# Patient Record
Sex: Female | Born: 1965 | Race: White | Hispanic: No | Marital: Married | Smoking: Never smoker
Health system: Southern US, Community
[De-identification: ages and names within clinical notes are randomized; demographics above are authoritative.]

## PROBLEM LIST (undated history)

## (undated) ENCOUNTER — Emergency Department (HOSPITAL_COMMUNITY): Admission: EM | Discharge status: Absent without leave | Payer: BLUE CROSS/BLUE SHIELD

---

## 1998-03-25 ENCOUNTER — Other Ambulatory Visit: Admission: RE | Admit: 1998-03-25 | Discharge: 1998-03-25 | Payer: Self-pay | Admitting: Obstetrics and Gynecology

## 1998-12-19 ENCOUNTER — Encounter: Payer: Self-pay | Admitting: Specialist

## 1998-12-19 ENCOUNTER — Ambulatory Visit (HOSPITAL_COMMUNITY): Admission: RE | Admit: 1998-12-19 | Discharge: 1998-12-19 | Payer: Self-pay | Admitting: Specialist

## 1999-04-06 ENCOUNTER — Encounter: Payer: Self-pay | Admitting: Specialist

## 1999-04-06 ENCOUNTER — Ambulatory Visit (HOSPITAL_COMMUNITY): Admission: RE | Admit: 1999-04-06 | Discharge: 1999-04-06 | Payer: Self-pay | Admitting: Specialist

## 1999-04-30 ENCOUNTER — Other Ambulatory Visit: Admission: RE | Admit: 1999-04-30 | Discharge: 1999-04-30 | Payer: Self-pay | Admitting: Obstetrics and Gynecology

## 2000-05-30 ENCOUNTER — Other Ambulatory Visit: Admission: RE | Admit: 2000-05-30 | Discharge: 2000-05-30 | Payer: Self-pay | Admitting: Obstetrics and Gynecology

## 2002-03-07 ENCOUNTER — Other Ambulatory Visit: Admission: RE | Admit: 2002-03-07 | Discharge: 2002-03-07 | Payer: Self-pay | Admitting: Obstetrics and Gynecology

## 2002-06-06 ENCOUNTER — Ambulatory Visit (HOSPITAL_COMMUNITY): Admission: RE | Admit: 2002-06-06 | Discharge: 2002-06-06 | Payer: Self-pay | Admitting: Obstetrics and Gynecology

## 2002-06-06 ENCOUNTER — Encounter: Payer: Self-pay | Admitting: Obstetrics and Gynecology

## 2002-10-08 ENCOUNTER — Ambulatory Visit (HOSPITAL_COMMUNITY): Admission: RE | Admit: 2002-10-08 | Discharge: 2002-10-08 | Payer: Self-pay | Admitting: Obstetrics and Gynecology

## 2002-11-13 ENCOUNTER — Encounter: Payer: Self-pay | Admitting: *Deleted

## 2002-11-13 ENCOUNTER — Inpatient Hospital Stay (HOSPITAL_COMMUNITY): Admission: AD | Admit: 2002-11-13 | Discharge: 2002-11-13 | Payer: Self-pay | Admitting: *Deleted

## 2003-02-20 ENCOUNTER — Inpatient Hospital Stay (HOSPITAL_COMMUNITY): Admission: AD | Admit: 2003-02-20 | Discharge: 2003-02-24 | Payer: Self-pay | Admitting: Obstetrics and Gynecology

## 2003-02-25 ENCOUNTER — Inpatient Hospital Stay (HOSPITAL_COMMUNITY): Admission: AD | Admit: 2003-02-25 | Discharge: 2003-02-25 | Payer: Self-pay | Admitting: Obstetrics & Gynecology

## 2003-04-11 ENCOUNTER — Other Ambulatory Visit: Admission: RE | Admit: 2003-04-11 | Discharge: 2003-04-11 | Payer: Self-pay | Admitting: Obstetrics and Gynecology

## 2004-04-23 ENCOUNTER — Other Ambulatory Visit: Admission: RE | Admit: 2004-04-23 | Discharge: 2004-04-23 | Payer: Self-pay | Admitting: Obstetrics and Gynecology

## 2005-10-31 ENCOUNTER — Emergency Department (HOSPITAL_COMMUNITY): Admission: EM | Admit: 2005-10-31 | Discharge: 2005-10-31 | Payer: Self-pay | Admitting: Emergency Medicine

## 2007-02-28 ENCOUNTER — Ambulatory Visit: Payer: Self-pay | Admitting: Pulmonary Disease

## 2007-02-28 ENCOUNTER — Telehealth: Payer: Self-pay | Admitting: Pulmonary Disease

## 2007-02-28 DIAGNOSIS — J309 Allergic rhinitis, unspecified: Secondary | ICD-10-CM | POA: Insufficient documentation

## 2007-02-28 DIAGNOSIS — R059 Cough, unspecified: Secondary | ICD-10-CM | POA: Insufficient documentation

## 2007-02-28 DIAGNOSIS — R05 Cough: Secondary | ICD-10-CM

## 2007-03-01 ENCOUNTER — Telehealth: Payer: Self-pay | Admitting: Pulmonary Disease

## 2007-03-02 ENCOUNTER — Telehealth: Payer: Self-pay | Admitting: Pulmonary Disease

## 2007-03-09 ENCOUNTER — Telehealth: Payer: Self-pay | Admitting: Pulmonary Disease

## 2007-04-28 ENCOUNTER — Telehealth (INDEPENDENT_AMBULATORY_CARE_PROVIDER_SITE_OTHER): Payer: Self-pay | Admitting: *Deleted

## 2007-05-10 ENCOUNTER — Telehealth (INDEPENDENT_AMBULATORY_CARE_PROVIDER_SITE_OTHER): Payer: Self-pay | Admitting: *Deleted

## 2008-01-23 ENCOUNTER — Telehealth (INDEPENDENT_AMBULATORY_CARE_PROVIDER_SITE_OTHER): Payer: Self-pay | Admitting: *Deleted

## 2010-06-26 NOTE — Op Note (Signed)
NAME:  Carla Hooper, Carla Hooper                         ACCOUNT NO.:  [id number]   MEDICAL RECORD NO.:  [id number]                   PATIENT TYPE:  INP   LOCATION:  9122                                 FACILITY:  [facility]   PHYSICIAN:  Dineen Kid. Rana Snare, M.D.                 DATE OF BIRTH:  24-Jul-1965   DATE OF PROCEDURE:  02/21/2003  DATE OF DISCHARGE:                                 OPERATIVE REPORT   PREOPERATIVE DIAGNOSIS:  Intrauterine pregnancy at 38 weeks with arrest of  descent.   POSTOPERATIVE DIAGNOSIS:  Intrauterine pregnancy at 38 weeks with arrest of  descent.   PROCEDURE:  Primary low segment transverse cesarean section.   SURGEON:  Dineen Kid. Rana Snare, M.D.   ANESTHESIA:  Epidural.   INDICATIONS:  [name] is a 45 year old G2, P0, A1, who presented at 38  weeks with spontaneous rupture of membranes around 1600 on February 20, 2003.  With Pitocin augmentation she progressed to complete, at which time she was  unable to push because she was uncomfortable with the pressure of  contractions.  We increased the epidural and she did not push very  effectively with the epidural.  Allowed her to labor down to a 0 station  with left occiput anterior presentation.  She was allowed to push then for  approximately two to 2-1/2 hours without effecting any further descent,  began to get fetal tachycardia, and because of arrest of descent we  proceeded with primary low segment transverse cesarean section.  The risks  and benefits were discussed at length.  Informed consent was obtained.   Findings at the time of surgery were a viable female infant, Apgars were 8  and 9, weight 6 pounds 13 ounces.   DESCRIPTION OF PROCEDURE:  After adequate analgesia the patient was placed  in the supine position with a left lateral tilt.  She was sterilely prepped  and draped.  A Foley catheter had already previously been sterilely placed.  A Pfannenstiel skin incision was made two fingerbreadths above the pubic  symphysis, taken down sharply to the fascia, which was incised transversely,  extended superiorly and inferiorly off the bellies of the rectus muscle,  which were separated sharply in the midline and the peritoneum was entered  sharply.  The bladder flap was created and replaced behind the bladder  blade.  A low segment myotomy incision was made down to the infant's vertex,  extended laterally with the operator's fingertips.  The infant's vertex was  then delivered atraumatically, and the nose and pharynx were then suctioned.  The infant was then delivered, the cord clamp and cut, and handed to the  pediatricians for resuscitation.  Cord blood was obtained, the placenta  extracted manually.  The uterus was exteriorized, wiped clean with a dry  lap.  The myotomy incision was closed in two layers, the first being a  running locking layer, the second being  an imbricating layer of 0 Monocryl  suture.  Irrigation was applied and after adequate hemostasis, the uterus  was placed back in the peritoneal cavity.  The peritoneum was closed with 0  Monocryl and the rectus muscle plicated in the midline and irrigation was  applied, and after adequate hemostasis the fascia was closed with a #1  Vicryl in a running fashion.  Irrigation was applied and after adequate  hemostasis, the skin was stapled and Steri-Strips applied.  The patient  tolerated the procedure well, was stable on transfer to the recovery  room.  The sponge and needle count was normal x3.  Estimated blood loss was  800 mL.  The patient received 1 g of Rocephin after delivery of the placenta  and will stay on this for 24 hours due to the fetal tachycardia during the  end of labor.                                               Dineen Kid Rana Snare, M.D.    DCL/MEDQ  D:  02/21/2003  T:  02/21/2003  Job:  [id number]

## 2010-06-26 NOTE — Discharge Summary (Signed)
NAME:  Carla Hooper, Carla Hooper                         ACCOUNT NO.:  [id number]   MEDICAL RECORD NO.:  [id number]                   PATIENT TYPE:  INP   LOCATION:  9122                                 FACILITY:  [facility]   PHYSICIAN:  Tracie Harrier, M.D.              DATE OF BIRTH:  02-Mar-1965   DATE OF ADMISSION:  02/20/2003  DATE OF DISCHARGE:  02/24/2003                                 DISCHARGE SUMMARY   ADMITTING DIAGNOSES:  1. Intrauterine pregnancy at 66 weeks' estimated gestational age.  2. Spontaneous rupture of membranes.   DISCHARGE DIAGNOSES:  1. Status post low-transverse cesarean section secondary to arrest of     descent.  2. Viable female infant.   PROCEDURE:  Primary low-transverse cesarean section.   REASON FOR ADMISSION:  Please see written H&P.   HOSPITAL COURSE:  The patient was a 45 year old, gravida 2 para 0, that  presented to [facility] at 52 weeks' estimated gestational  age with spontaneous rupture of membranes.  The cervix was noted to be 1- to  -2-cm dilated, 50% effaced, vertex at a -3 station.  Amniotic fluid was  noted to be clear.  Fetal heart tones are reactive.  Labor was augmented  with Pitocin, and the patient did progress to complete dilation, at which  time she was unable to push because she was uncomfortable with the pressures  of her contractions.  Epidural was placed, and she was not able to  effectively push with the epidural.  The patient did push for approximately  2.5 hours without effecting any further descent.  Vertex was at a 0 station.  Due to fetal tachycardia and arrest of descent, the decision was made to  proceed with a primary low-transverse cesarean section.  The patient was  then transferred to the operating room where epidural was dosed to an  adequate surgical level.  A low-transverse incision was made with the  delivery of a viable female infant weighing 6 pounds 13 ounces with Apgar's  of 8 at one minute and 9  at five minutes.  Umbilical cord pH was 7.35.  The  patient tolerated the procedure well and was taken to the recovery room in  stable condition.   On postoperative day one, the patient was without complaint.  Vital signs  were stable.  She was afebrile.  Abdomen was soft with good return of bowel  function.  Fundus was firm and nontender.  Abdominal dressing was noted to  have a small amount of old drainage noted on bandage.  Labs revealed a  hemoglobin of 10.1, platelet count 148,000, WBC count of 11.2.  On  postoperative day two, vital signs were stable.  The patient was without  complaint.  Abdominal dressing had been removed revealing an incision that  was clean, dry and intact.  The patient was ambulating well, tolerating a  regular diet without complaints of nausea or vomiting.  On postoperative day  three, the patient was doing well.  Vital signs were stable.  The incision  was clean, dry and intact.  The staples were removed.  The patient was  discharged home.   CONDITION ON DISCHARGE:  Good.   DIET:  Regular as tolerated.   ACTIVITY:  No heavy lifting.  No driving x 2 weeks.  No vaginal entry.   FOLLOWUP:  The patient is to followup in the office in 1-2 weeks for an  incision check.  She is to call for a temperature greater than 100 degrees,  persistent nausea/vomiting, heavy vaginal bleeding, and/or redness or  drainage from the incisional site.   DISCHARGE MEDICATIONS:  1. Vicodin, #30, one p.o. every four to six hours p.r.n. pain with one     additional refill.  2. Motrin 600 mg every six hours p.r.n.  3. Prenatal vitamins one p.o. daily.  4. Colace one p.o. daily p.r.n.     Julio Sicks, N.P.                        Tracie Harrier, M.D.    CC/MEDQ  D:  03/29/2003  T:  03/30/2003  Job:  [id number]

## 2011-01-13 ENCOUNTER — Institutional Professional Consult (permissible substitution): Payer: Self-pay | Admitting: Cardiology

## 2014-02-22 ENCOUNTER — Emergency Department (HOSPITAL_COMMUNITY): Payer: BLUE CROSS/BLUE SHIELD

## 2014-02-22 ENCOUNTER — Encounter (HOSPITAL_COMMUNITY): Payer: Self-pay | Admitting: *Deleted

## 2014-02-22 ENCOUNTER — Emergency Department (HOSPITAL_COMMUNITY)
Admission: EM | Admit: 2014-02-22 | Discharge: 2014-02-22 | Discharge status: Against Medical Advice | Payer: BLUE CROSS/BLUE SHIELD | Source: Home / Self Care | Attending: Family Medicine | Admitting: Family Medicine

## 2014-02-22 ENCOUNTER — Encounter (HOSPITAL_COMMUNITY): Payer: Self-pay

## 2014-02-22 ENCOUNTER — Emergency Department (HOSPITAL_COMMUNITY)
Admission: EM | Admit: 2014-02-22 | Discharge: 2014-02-22 | Disposition: A | Discharge status: Home / Self Care | Payer: BLUE CROSS/BLUE SHIELD | Attending: Emergency Medicine | Admitting: Emergency Medicine

## 2014-02-22 DIAGNOSIS — R0789 Other chest pain: Secondary | ICD-10-CM | POA: Insufficient documentation

## 2014-02-22 DIAGNOSIS — F419 Anxiety disorder, unspecified: Secondary | ICD-10-CM | POA: Insufficient documentation

## 2014-02-22 DIAGNOSIS — R079 Chest pain, unspecified: Secondary | ICD-10-CM | POA: Diagnosis present

## 2014-02-22 DIAGNOSIS — I10 Essential (primary) hypertension: Secondary | ICD-10-CM

## 2014-02-22 DIAGNOSIS — Z79899 Other long term (current) drug therapy: Secondary | ICD-10-CM | POA: Insufficient documentation

## 2014-02-22 LAB — CBC
HCT: 39.7 % (ref 36.0–46.0)
Hemoglobin: 13.6 g/dL (ref 12.0–15.0)
MCH: 32.4 pg (ref 26.0–34.0)
MCHC: 34.3 g/dL (ref 30.0–36.0)
MCV: 94.5 fL (ref 78.0–100.0)
PLATELETS: 263 10*3/uL (ref 150–400)
RBC: 4.2 MIL/uL (ref 3.87–5.11)
RDW: 11.8 % (ref 11.5–15.5)
WBC: 7.7 10*3/uL (ref 4.0–10.5)

## 2014-02-22 LAB — BASIC METABOLIC PANEL
ANION GAP: 10 (ref 5–15)
BUN: 11 mg/dL (ref 6–23)
CALCIUM: 9 mg/dL (ref 8.4–10.5)
CO2: 27 mmol/L (ref 19–32)
Chloride: 104 mEq/L (ref 96–112)
Creatinine, Ser: 0.75 mg/dL (ref 0.50–1.10)
GFR calc Af Amer: >90 mL/min (ref >90)
GLUCOSE: 100 mg/dL — AB (ref 70–99)
POTASSIUM: 3.8 mmol/L (ref 3.5–5.1)
Sodium: 141 mmol/L (ref 135–145)

## 2014-02-22 LAB — I-STAT TROPONIN, ED: TROPONIN I, POC: 0 ng/mL (ref 0.00–0.08)

## 2014-02-22 MED ORDER — HYDROCHLOROTHIAZIDE 25 MG PO TABS: 25.0000 mg | ORAL_TABLET | Freq: Every day | ORAL | Status: AC

## 2014-02-22 NOTE — ED Notes (Signed)
States she was informed at her OB visit, that her BP is elevated. When VS checked, she became anxious , tearful. C/o tightness in mid chest, left chest. Concerned she may be having an anxiety episode. Skin w/d/color good

## 2014-02-22 NOTE — ED Provider Notes (Signed)
CSN: [id number]     Arrival date & time 02/22/14  1229 History   First MD Initiated Contact with Patient 02/22/14 1325     Chief Complaint  Patient presents with  . Hypertension   (Consider location/radiation/quality/duration/timing/severity/associated sxs/prior Treatment) HPI Comments: Patient States she has been under a great deal of stress since her mother died in 12/19/2013. However, she endorses having intermittent episodes of left chest tightness over the past 6-8 mos. Has seen a cardiologist in the past, but this was for palpitations and reports that her evaluation was reported as normal (Dr. Erlene Quan). She is also concerned that when she went to her annual ObGyn visit 2 days ago, she was told that her BP was elevated and it has remained elevated. Has been checking BP on daily basis. Has never been treat for HTN in the past. PCP: Dr. Cyndia Bent  Patient is a 48 y.o. female presenting with hypertension and chest pain. The history is provided by the patient.  Hypertension Associated symptoms include chest pain. Pertinent negatives include no abdominal pain, no headaches and no shortness of breath.  Chest Pain Pain location:  L chest Pain quality: pressure and tightness   Pain radiates to:  Does not radiate Pain radiates to the back: no   Pain severity:  Moderate Onset quality:  Gradual Duration:  6 months Timing:  Intermittent Progression:  Waxing and waning Chronicity:  New Context: stress   Relieved by:  Nothing Worsened by:  Nothing tried Ineffective treatments:  None tried Associated symptoms: anxiety and fatigue   Associated symptoms: no abdominal pain, no AICD problem, no altered mental status, no anorexia, no back pain, no claudication, no cough, no diaphoresis, no dizziness, no dysphagia, no fever, no headache, no heartburn, no lower extremity edema, no nausea, no near-syncope, no numbness, no orthopnea, no palpitations, no PND, no shortness of breath, no syncope, not vomiting  and no weakness   Risk factors: hypertension   Risk factors: no coronary artery disease, no high cholesterol, no prior DVT/PE and no smoking     History reviewed. No pertinent past medical history. History reviewed. No pertinent past surgical history. History reviewed. No pertinent family history. History  Substance Use Topics  . Smoking status: Never Smoker   . Smokeless tobacco: Not on file  . Alcohol Use: Yes     Comment: rare   OB History    No data available     Review of Systems  Constitutional: Positive for fatigue. Negative for fever and diaphoresis.  HENT: Negative.  Negative for trouble swallowing.   Eyes: Negative.   Respiratory: Positive for chest tightness. Negative for cough, shortness of breath and wheezing.   Cardiovascular: Positive for chest pain. Negative for palpitations, orthopnea, claudication, leg swelling, syncope, PND and near-syncope.  Gastrointestinal: Negative for heartburn, nausea, vomiting, abdominal pain and anorexia.  Genitourinary: Negative.   Musculoskeletal: Negative for back pain.  Skin: Negative.   Neurological: Negative for dizziness, syncope, weakness, numbness and headaches.  Psychiatric/Behavioral: The patient is nervous/anxious.     Allergies  Review of patient's allergies indicates no known allergies.  Home Medications   Prior to Admission medications   Medication Sig Start Date End Date Taking? Authorizing Provider  zolpidem (AMBIEN) 5 MG tablet Take 5 mg by mouth at bedtime as needed for sleep.   Yes Historical Provider, MD   BP 180/110 mmHg  Pulse 93  Temp(Src) 97.2 F (36.2 C) (Oral)  Resp 20  SpO2 98% Physical Exam  Constitutional:  She is oriented to person, place, and time. She appears well-developed and well-nourished.  +tearful  HENT:  Head: Normocephalic and atraumatic.  Eyes: Conjunctivae are normal. No scleral icterus.  Neck: Normal range of motion. Neck supple. No JVD present.  Cardiovascular: Normal rate,  regular rhythm and normal heart sounds.   Pulmonary/Chest: Effort normal and breath sounds normal. No respiratory distress. She has no wheezes. She exhibits no tenderness.  Abdominal: Soft. Bowel sounds are normal. She exhibits no distension. There is no tenderness.  Musculoskeletal: Normal range of motion. She exhibits no edema.  Neurological: She is alert and oriented to person, place, and time.  Skin: Skin is warm and dry. No rash noted. No erythema.  Psychiatric: She has a normal mood and affect. Her behavior is normal.  Nursing note and vitals reviewed.   ED Course  Procedures (including critical care time) Labs Review Labs Reviewed - No data to display  Imaging Review No results found.   MDM   1. Chest tightness or pressure   ECG: NSR at 82 bpm with no ectopy or dynamic ST/T wave changes. Normal intervals. No previous tracings available for comparison. No evidence of STEMI It was advised that [facility] be [facility] further evaluation of chest pain and hypertension, however, patient states she does not want to go to the hospital as she is concerned about the high deductible on her insurance plan. After lengthy discussion about risks of not pursuing additional evaluation, patient still wished to sign out AMA. She was made aware that she was always welcome to return should she wish to be re-evaluated.    Ria ClockJennifer Lee H Osmany Azer, [place] 02/22/14 [phone]

## 2014-02-22 NOTE — Discharge Instructions (Signed)
As we discussed, I would strongly recommend that you seek additional evaluation at [facility] as soon as possible. I have concerns regarding not only your elevated blood pressure but your chest discomfort as well.

## 2014-02-22 NOTE — ED Provider Notes (Addendum)
CSN: [id number]     Arrival date & time 02/22/14  1421 History   First MD Initiated Contact with Patient 02/22/14 1723     Chief Complaint  Patient presents with  . Hypertension  . Chest Pain     (Consider location/radiation/quality/duration/timing/severity/associated sxs/prior Treatment) Patient is a 49 y.o. female presenting with hypertension and chest pain. The history is provided by the patient.  Hypertension This is a new (Have an annual OB/GYN visit 4 days ago and at that time her blood pressure was elevated in the office. She is now been checking every day since that time and is been elevated. Prior to this the last time her blood pressure was checked was 1 year ago.) problem. Episode onset: 4 days ago. The problem occurs constantly. The problem has not changed since onset.Associated symptoms include chest pain. Pertinent negatives include no headaches. Associated symptoms comments: Intermittent episodes of chest pressure that are rare and none currently.  States she has been very stressed and when she feels anxious she will get left sided chest pressure without SOB, diphoresis, N/V.  No recent OTC meds and no regular meds.  . Nothing aggravates the symptoms. Nothing relieves the symptoms. She has tried nothing for the symptoms. The treatment provided no relief.  Chest Pain Associated symptoms: no headache     History reviewed. No pertinent past medical history. History reviewed. No pertinent past surgical history. History reviewed. No pertinent family history. History  Substance Use Topics  . Smoking status: Never Smoker   . Smokeless tobacco: Not on file  . Alcohol Use: Yes     Comment: rare   OB History    No data available     Review of Systems  Cardiovascular: Positive for chest pain.  Neurological: Negative for headaches.  All other systems reviewed and are negative.     Allergies  Review of patient's allergies indicates no known allergies.  Home Medications    Prior to Admission medications   Medication Sig Start Date End Date Taking? Authorizing Provider  zolpidem (AMBIEN) 5 MG tablet Take 5 mg by mouth at bedtime as needed for sleep.    Historical Provider, MD   BP 143/95 mmHg  Pulse 77  Temp(Src) 98.3 F (36.8 C) (Oral)  Resp 21  SpO2 100%  LMP 02/22/2014 Physical Exam  Constitutional: She is oriented to person, place, and time. She appears well-developed and well-nourished. No distress.  tearful  HENT:  Head: Normocephalic and atraumatic.  Mouth/Throat: Oropharynx is clear and moist.  Eyes: Conjunctivae and EOM are normal. Pupils are equal, round, and reactive to light.  Neck: Normal range of motion. Neck supple.  Cardiovascular: Normal rate, regular rhythm and intact distal pulses.   No murmur heard. Pulmonary/Chest: Effort normal and breath sounds normal. No respiratory distress. She has no wheezes. She has no rales.  Abdominal: Soft. She exhibits no distension. There is no tenderness. There is no rebound and no guarding.  Musculoskeletal: Normal range of motion. She exhibits no edema or tenderness.  Neurological: She is alert and oriented to person, place, and time.  Skin: Skin is warm and dry. No rash noted. No erythema.  Psychiatric: Her behavior is normal. Her mood appears anxious.  Nursing note and vitals reviewed.   ED Course  Procedures (including critical care time) Labs Review Labs Reviewed  BASIC METABOLIC PANEL - Abnormal; Notable for the following:    Glucose, Bld 100 (*)    All other components within normal limits  CBC  Rosezena SensorI-STAT TROPOININ, ED    Imaging Review Dg Chest 2 View  02/22/2014   CLINICAL DATA:  Hypertension.  Chest tightness.  Initial encounter  EXAM: CHEST  2 VIEW  COMPARISON:  02/28/2007  FINDINGS: Midline trachea.  Normal heart size and mediastinal contours.  Sharp costophrenic angles.  No pneumothorax.  Clear lungs.  IMPRESSION: No active cardiopulmonary disease.   Electronically Signed   By:  Jeronimo GreavesKyle  Talbot M.D.   On: 02/22/2014 15:34     EKG Interpretation   Date/Time:  Friday February 22 2014 14:26:28 EST Ventricular Rate:  86 PR Interval:  124 QRS Duration: 84 QT Interval:  368 QTC Calculation: 440 R Axis:   3 Text Interpretation:  Normal sinus rhythm Normal ECG No significant change  since last tracing Confirmed by Anitra LauthPLUNKETT  MD, Alphonzo LemmingsWHITNEY ([id number]) on 02/22/2014  5:21:58 PM      MDM   Final diagnoses:  Essential hypertension    Patient presents with a history of hypertension that it's recently been brought to her attention when she went to her OB/GYN office 4 days ago. Patient has never had a prior history of hypertension in the past but last checked her blood pressure approximately 1 year ago. She is been under a great deal of stress and states eats a fairly high salt diet as well as a 10 pound weight gain. She has not had any lab testing done in approximately one year but denies a history of anemia no family history of cardiac disease or hypertension.  Patient has become very anxious because her blood pressure is been elevated and states she will occasionally get a chest pressure that is usually due to anxiety. She denies any chest pressure currently and she has no associated symptoms with it. The chest pain she describes sounds atypical she is not currently having an episode. Her EKG is within normal limits and a troponin done while she was waiting in triage is negative. Her creatinine and hemoglobin are normal. Discussed with patient the need to check a thyroid panel however she is requesting that she get that done at her PCP office. Patient's blood pressure here initially was in the 180s and then the last check was 145/95. Will start her on a low-dose of hydrochlorothiazide and have her follow-up with the PMD for further care. She was given strict return precautions for any chest pain, shortness of breath or focal weakness.    Gwyneth SproutWhitney Gianno Volner, MD 02/22/14 [id number]  Gwyneth SproutWhitney  Gaven Eugene, MD 02/22/14 1816

## 2014-02-22 NOTE — ED Notes (Signed)
Pt reports having her yearly check up and was told that her bp is elevated. Went to [facility] and sent here due to htn and pt became anxious due to htn and then had episode of chest pain. Pt is tearful and anxious at triage, ekg done.

## 2014-02-22 NOTE — ED Notes (Signed)
JL Presson , PA, advised of BP

## 2015-08-07 DIAGNOSIS — F419 Anxiety disorder, unspecified: Secondary | ICD-10-CM | POA: Diagnosis not present

## 2015-09-16 DIAGNOSIS — F411 Generalized anxiety disorder: Secondary | ICD-10-CM | POA: Diagnosis not present

## 2015-12-05 DIAGNOSIS — F411 Generalized anxiety disorder: Secondary | ICD-10-CM | POA: Diagnosis not present

## 2015-12-05 DIAGNOSIS — M5440 Lumbago with sciatica, unspecified side: Secondary | ICD-10-CM | POA: Diagnosis not present

## 2015-12-05 DIAGNOSIS — Z23 Encounter for immunization: Secondary | ICD-10-CM | POA: Diagnosis not present

## 2016-01-21 DIAGNOSIS — R5383 Other fatigue: Secondary | ICD-10-CM | POA: Diagnosis not present

## 2016-01-21 DIAGNOSIS — R2689 Other abnormalities of gait and mobility: Secondary | ICD-10-CM | POA: Diagnosis not present

## 2016-01-21 DIAGNOSIS — M2550 Pain in unspecified joint: Secondary | ICD-10-CM | POA: Diagnosis not present

## 2016-01-21 DIAGNOSIS — I10 Essential (primary) hypertension: Secondary | ICD-10-CM | POA: Diagnosis not present

## 2016-04-14 DIAGNOSIS — Z0000 Encounter for general adult medical examination without abnormal findings: Secondary | ICD-10-CM | POA: Diagnosis not present

## 2016-04-14 DIAGNOSIS — Z1321 Encounter for screening for nutritional disorder: Secondary | ICD-10-CM | POA: Diagnosis not present

## 2016-04-21 DIAGNOSIS — F321 Major depressive disorder, single episode, moderate: Secondary | ICD-10-CM | POA: Diagnosis not present

## 2016-04-21 DIAGNOSIS — I10 Essential (primary) hypertension: Secondary | ICD-10-CM | POA: Diagnosis not present

## 2016-04-21 DIAGNOSIS — Z0000 Encounter for general adult medical examination without abnormal findings: Secondary | ICD-10-CM | POA: Diagnosis not present

## 2016-04-21 DIAGNOSIS — F5104 Psychophysiologic insomnia: Secondary | ICD-10-CM | POA: Diagnosis not present

## 2016-05-26 DIAGNOSIS — R079 Chest pain, unspecified: Secondary | ICD-10-CM | POA: Diagnosis not present

## 2016-05-26 DIAGNOSIS — R0789 Other chest pain: Secondary | ICD-10-CM | POA: Diagnosis not present

## 2016-05-26 DIAGNOSIS — M546 Pain in thoracic spine: Secondary | ICD-10-CM | POA: Diagnosis not present

## 2016-06-05 DIAGNOSIS — G8929 Other chronic pain: Secondary | ICD-10-CM | POA: Diagnosis not present

## 2016-06-05 DIAGNOSIS — M545 Low back pain: Secondary | ICD-10-CM | POA: Diagnosis not present

## 2016-07-21 DIAGNOSIS — Z6828 Body mass index (BMI) 28.0-28.9, adult: Secondary | ICD-10-CM | POA: Diagnosis not present

## 2016-07-21 DIAGNOSIS — Z01419 Encounter for gynecological examination (general) (routine) without abnormal findings: Secondary | ICD-10-CM | POA: Diagnosis not present

## 2016-07-21 DIAGNOSIS — Z1231 Encounter for screening mammogram for malignant neoplasm of breast: Secondary | ICD-10-CM | POA: Diagnosis not present

## 2016-08-18 DIAGNOSIS — Z13820 Encounter for screening for osteoporosis: Secondary | ICD-10-CM | POA: Diagnosis not present

## 2016-08-26 ENCOUNTER — Encounter: Payer: Self-pay | Admitting: Obstetrics and Gynecology

## 2016-09-28 DIAGNOSIS — E876 Hypokalemia: Secondary | ICD-10-CM | POA: Diagnosis not present

## 2016-09-28 DIAGNOSIS — E871 Hypo-osmolality and hyponatremia: Secondary | ICD-10-CM | POA: Diagnosis not present

## 2016-09-28 DIAGNOSIS — R509 Fever, unspecified: Secondary | ICD-10-CM | POA: Diagnosis not present

## 2016-09-28 DIAGNOSIS — R109 Unspecified abdominal pain: Secondary | ICD-10-CM | POA: Diagnosis not present

## 2016-09-29 DIAGNOSIS — R110 Nausea: Secondary | ICD-10-CM | POA: Diagnosis not present

## 2016-09-29 DIAGNOSIS — R1084 Generalized abdominal pain: Secondary | ICD-10-CM | POA: Diagnosis not present

## 2016-09-29 DIAGNOSIS — R945 Abnormal results of liver function studies: Secondary | ICD-10-CM | POA: Diagnosis not present

## 2016-09-29 DIAGNOSIS — E860 Dehydration: Secondary | ICD-10-CM | POA: Diagnosis not present

## 2016-09-30 DIAGNOSIS — R1084 Generalized abdominal pain: Secondary | ICD-10-CM | POA: Diagnosis not present

## 2016-10-01 DIAGNOSIS — R197 Diarrhea, unspecified: Secondary | ICD-10-CM | POA: Diagnosis not present

## 2016-10-02 ENCOUNTER — Emergency Department (HOSPITAL_COMMUNITY)
Admission: EM | Admit: 2016-10-02 | Discharge: 2016-10-02 | Disposition: A | Discharge status: Home / Self Care | Payer: BLUE CROSS/BLUE SHIELD | Attending: Emergency Medicine | Admitting: Emergency Medicine

## 2016-10-02 ENCOUNTER — Encounter (HOSPITAL_COMMUNITY): Payer: Self-pay | Admitting: Emergency Medicine

## 2016-10-02 DIAGNOSIS — E860 Dehydration: Secondary | ICD-10-CM | POA: Insufficient documentation

## 2016-10-02 DIAGNOSIS — R109 Unspecified abdominal pain: Secondary | ICD-10-CM | POA: Diagnosis not present

## 2016-10-02 DIAGNOSIS — Z79899 Other long term (current) drug therapy: Secondary | ICD-10-CM | POA: Diagnosis not present

## 2016-10-02 DIAGNOSIS — R197 Diarrhea, unspecified: Secondary | ICD-10-CM | POA: Diagnosis not present

## 2016-10-02 DIAGNOSIS — E876 Hypokalemia: Secondary | ICD-10-CM | POA: Diagnosis not present

## 2016-10-02 LAB — CBC
HCT: 34.7 % — ABNORMAL LOW (ref 36.0–46.0)
Hemoglobin: 12.3 g/dL (ref 12.0–15.0)
MCH: 31.5 pg (ref 26.0–34.0)
MCHC: 35.4 g/dL (ref 30.0–36.0)
MCV: 89 fL (ref 78.0–100.0)
Platelets: 273 10*3/uL (ref 150–400)
RBC: 3.9 MIL/uL (ref 3.87–5.11)
RDW: 12 % (ref 11.5–15.5)
WBC: 7.1 10*3/uL (ref 4.0–10.5)

## 2016-10-02 LAB — COMPREHENSIVE METABOLIC PANEL
ALT: 41 U/L (ref 14–54)
AST: 33 U/L (ref 15–41)
Albumin: 3.5 g/dL (ref 3.5–5.0)
Alkaline Phosphatase: 80 U/L (ref 38–126)
Anion gap: 8 (ref 5–15)
BUN: 9 mg/dL (ref 6–20)
CALCIUM: 8.8 mg/dL — AB (ref 8.9–10.3)
CHLORIDE: 96 mmol/L — AB (ref 101–111)
CO2: 30 mmol/L (ref 22–32)
Creatinine, Ser: 0.87 mg/dL (ref 0.44–1.00)
GFR calc Af Amer: >60 mL/min (ref >60)
GFR calc non Af Amer: >60 mL/min (ref >60)
Glucose, Bld: 100 mg/dL — ABNORMAL HIGH (ref 65–99)
Potassium: 2.5 mmol/L — CL (ref 3.5–5.1)
SODIUM: 134 mmol/L — AB (ref 135–145)
Total Bilirubin: 0.6 mg/dL (ref 0.3–1.2)
Total Protein: 6.8 g/dL (ref 6.5–8.1)

## 2016-10-02 LAB — POTASSIUM: Potassium: 3.1 mmol/L — ABNORMAL LOW (ref 3.5–5.1)

## 2016-10-02 LAB — LIPASE, BLOOD: LIPASE: 271 U/L — AB (ref 11–51)

## 2016-10-02 LAB — MAGNESIUM: MAGNESIUM: 2 mg/dL (ref 1.7–2.4)

## 2016-10-02 MED ORDER — SODIUM CHLORIDE 0.9 % IV BOLUS (SEPSIS)
1000.0000 mL | Freq: Once | INTRAVENOUS | Status: AC
  Administered 2016-10-02: 1000 mL via INTRAVENOUS

## 2016-10-02 MED ORDER — CIPROFLOXACIN HCL 500 MG PO TABS: 500.0000 mg | ORAL_TABLET | Freq: Two times a day (BID) | ORAL | 0 refills | Status: AC

## 2016-10-02 MED ORDER — CIPROFLOXACIN HCL 500 MG PO TABS
500.0000 mg | ORAL_TABLET | Freq: Once | ORAL | Status: AC
  Administered 2016-10-02: 500 mg via ORAL
  Filled 2016-10-02: qty 1

## 2016-10-02 MED ORDER — LOPERAMIDE HCL 2 MG PO CAPS
4.0000 mg | ORAL_CAPSULE | Freq: Once | ORAL | Status: AC
  Administered 2016-10-02: 4 mg via ORAL
  Filled 2016-10-02: qty 2

## 2016-10-02 MED ORDER — POTASSIUM CHLORIDE CRYS ER 20 MEQ PO TBCR
60.0000 meq | EXTENDED_RELEASE_TABLET | Freq: Once | ORAL | Status: AC
  Administered 2016-10-02: 60 meq via ORAL
  Filled 2016-10-02: qty 3

## 2016-10-02 MED ORDER — SODIUM CHLORIDE 0.9 % IV BOLUS (SEPSIS)
2000.0000 mL | Freq: Once | INTRAVENOUS | Status: AC
  Administered 2016-10-02: 2000 mL via INTRAVENOUS

## 2016-10-02 MED ORDER — POTASSIUM CHLORIDE CRYS ER 20 MEQ PO TBCR
40.0000 meq | EXTENDED_RELEASE_TABLET | Freq: Once | ORAL | Status: AC
  Administered 2016-10-02: 40 meq via ORAL
  Filled 2016-10-02: qty 2

## 2016-10-02 NOTE — ED Provider Notes (Signed)
[facility]-EMERGENCY DEPT Provider Note   CSN: [id number] Arrival date & time: 10/02/16  1001     History   Chief Complaint Chief Complaint  Patient presents with  . Abdominal Pain  . Diarrhea    HPI Carla Hooper is a 51 y.o. female.patient complains of diarrhea onset 6 days ago accompanied by intermittent crampy lower abdominal pain. She is pain free at present. She reports she had 50 episodes of diarrhea 6 days ago. Diarrhea had slowed after a few days. Associated symptoms include fever with maximum temperature of 102 degrees 3 days ago. She saw her primary care physician 5 days ago who referred her to a gastroenterologist. She saw a gastroenterologist's PA was prescribed Zantac and received 1 L of intravenous fluids in ER. Diarrhea transiently slowed until last night when she had 10 episodes of mucousy diarrhea since last night. She denies abdominal pain at present. She treated herself with one dose of Imodium since onset of illness without relief.Has also had one or 2 doses of IV probiotic She denies recent travel or antibiotic use denies nausea or vomiting. She ate pizza last night. Feels dehydrated. Nothing makes symptoms better or worse.  HPI  History reviewed. No pertinent past medical history.  Patient Active Problem List   Diagnosis Date Noted  . ALLERGIC RHINITIS 02/28/2007  . COUGH 02/28/2007    History reviewed. No pertinent surgical history.  OB History    No data available       Home Medications    Prior to Admission medications   Medication Sig Start Date End Date Taking? Authorizing Provider  hydrochlorothiazide (HYDRODIURIL) 25 MG tablet Take 1 tablet (25 mg total) by mouth daily. 02/22/14   Gwyneth Sprout, MD  zolpidem (AMBIEN) 5 MG tablet Take 5 mg by mouth at bedtime as needed for sleep.    [provider]    Family History History reviewed. No pertinent family history.  Social History Social History  Substance Use Topics  . Smoking  status: Never Smoker  . Smokeless tobacco: Not on file  . Alcohol use Yes     Comment: rare     Allergies   Patient has no known allergies.   Review of Systems Review of Systems  Constitutional: Positive for fever.  HENT: Negative.   Respiratory: Negative.   Cardiovascular: Negative.   Gastrointestinal: Positive for abdominal pain and diarrhea.  Musculoskeletal: Negative.   Skin: Negative.   Neurological: Positive for light-headedness.  Psychiatric/Behavioral: Negative.   All other systems reviewed and are negative.    Physical Exam Updated Vital Signs BP 105/65   Pulse 99   Temp 99 F (37.2 C) (Oral)   Resp 16   SpO2 99%   Physical Exam  Constitutional: She appears well-developed and well-nourished. No distress.  Mucous members dry  HENT:  Head: Normocephalic and atraumatic.  Eyes: Pupils are equal, round, and reactive to light. Conjunctivae are normal.  Neck: Neck supple. No tracheal deviation present. No thyromegaly present.  Cardiovascular: Normal rate and regular rhythm.   No murmur heard. Pulmonary/Chest: Effort normal and breath sounds normal.  Abdominal: Soft. Bowel sounds are normal. She exhibits no distension. There is no tenderness.  Musculoskeletal: Normal range of motion. She exhibits no edema or tenderness.  Neurological: She is alert. Coordination normal.  Skin: Skin is warm and dry. No rash noted.  Psychiatric: She has a normal mood and affect.  Nursing note and vitals reviewed.    ED Treatments / Results  Labs (all  labs ordered are listed, but only abnormal results are displayed) Labs Reviewed  LIPASE, BLOOD - Abnormal; Notable for the following:       Result Value   Lipase 271 (*)    All other components within normal limits  COMPREHENSIVE METABOLIC PANEL - Abnormal; Notable for the following:    Sodium 134 (*)    Potassium 2.5 (*)    Chloride 96 (*)    Glucose, Bld 100 (*)    Calcium 8.8 (*)    All other components within normal  limits  CBC - Abnormal; Notable for the following:    HCT 34.7 (*)    All other components within normal limits  MAGNESIUM    EKG  EKG Interpretation  Date/Time:  Saturday October 02 2016 13:10:24 EDT Ventricular Rate:  86 PR Interval:    QRS Duration: 102 QT Interval:  371 QTC Calculation: 444 R Axis:   -4 Text Interpretation:  Sinus rhythm Low voltage, precordial leads Borderline T abnormalities, diffuse leads No significant change since last tracing Confirmed by Doug Sou [phone]) on 10/02/2016 1:13:13 PM      Results for orders placed or performed during the hospital encounter of 10/02/16  Lipase, blood  Result Value Ref Range   Lipase 271 (H) 11 - 51 U/L  Comprehensive metabolic panel  Result Value Ref Range   Sodium 134 (L) 135 - 145 mmol/L   Potassium 2.5 (LL) 3.5 - 5.1 mmol/L   Chloride 96 (L) 101 - 111 mmol/L   CO2 30 22 - 32 mmol/L   Glucose, Bld 100 (H) 65 - 99 mg/dL   BUN 9 6 - 20 mg/dL   Creatinine, Ser 8.32 0.44 - 1.00 mg/dL   Calcium 8.8 (L) 8.9 - 10.3 mg/dL   Total Protein 6.8 6.5 - 8.1 g/dL   Albumin 3.5 3.5 - 5.0 g/dL   AST 33 15 - 41 U/L   ALT 41 14 - 54 U/L   Alkaline Phosphatase 80 38 - 126 U/L   Total Bilirubin 0.6 0.3 - 1.2 mg/dL   GFR calc non Af Amer >60 >60 mL/min   GFR calc Af Amer >60 >60 mL/min   Anion gap 8 5 - 15  CBC  Result Value Ref Range   WBC 7.1 4.0 - 10.5 K/uL   RBC 3.90 3.87 - 5.11 MIL/uL   Hemoglobin 12.3 12.0 - 15.0 g/dL   HCT 91.9 (L) 16.6 - 06.0 %   MCV 89.0 78.0 - 100.0 fL   MCH 31.5 26.0 - 34.0 pg   MCHC 35.4 30.0 - 36.0 g/dL   RDW 04.5 99.7 - 74.1 %   Platelets 273 150 - 400 K/uL  Magnesium  Result Value Ref Range   Magnesium 2.0 1.7 - 2.4 mg/dL  Potassium  Result Value Ref Range   Potassium 3.1 (L) 3.5 - 5.1 mmol/L   No results found.  Radiology No results found.  Procedures Procedures (including critical care time)  Medications Ordered in ED Medications  potassium chloride SA (K-DUR,KLOR-CON) CR  tablet 60 mEq (not administered)  sodium chloride 0.9 % bolus 2,000 mL (not administered)  loperamide (IMODIUM) capsule 4 mg (not administered)     Initial Impression / Assessment and Plan / ED Course  I have reviewed the triage vital signs and the nursing notes.  Pertinent labs & imaging results that were available during my care of the patient were reviewed by me and considered in my medical decision making (see chart for details).  Patient has had 2-3 diarrhea episodeswhile here. Treated with Imodium. Hypokalemia has improved after treatment with oral potassiumsupplementation and intravenous fluids.4:45 PM feels ready to go home Plan prescription Cipro.she reports to me that she has a potassium supplement waiting for her at her local pharmacy Stool cultures pending which was ordered by Dr.Pharr. She'll receive first dose Cipro here. She is encouraged to avoid dairy. Oral hydration encouraged. Follow-up with Dr.Pharr in 2 days Final Clinical Impressions(s) / ED Diagnoses  Diagnosis #1 diarrhea #2 dehydration #3 hypokalemia Final diagnoses:  None    New Prescriptions New Prescriptions   No medications on file     Doug Sou, MD 10/02/16 1650

## 2016-10-02 NOTE — Discharge Instructions (Signed)
Take Imodium as directed. One dose after loose stool up to 8 doses per day. Avoid milk or foods containing milk such as cheese or ice cream all having diarrhea.Make sure that you drink at least six 8 ounce glasses of water or Gatorade each day in order to stay well-hydrated.  Take Tylenol as directed for abdominal cramps.Take the antibiotic Cipro as prescribed as well as a potassium supplement that you were formerly prescribed. Follow-up with Dr.Pharr in 2 days to get the result of your stool culture.

## 2016-10-02 NOTE — ED Triage Notes (Signed)
Pt reports bilateral lower abd pain and diarrhea for the past 8 days. Pt reports she has been to the doctor's office 2x for this, but is not feeling better.

## 2016-10-06 DIAGNOSIS — R1013 Epigastric pain: Secondary | ICD-10-CM | POA: Diagnosis not present

## 2016-10-06 DIAGNOSIS — R1030 Lower abdominal pain, unspecified: Secondary | ICD-10-CM | POA: Diagnosis not present

## 2016-10-06 DIAGNOSIS — A020 Salmonella enteritis: Secondary | ICD-10-CM | POA: Diagnosis not present

## 2016-10-06 DIAGNOSIS — R197 Diarrhea, unspecified: Secondary | ICD-10-CM | POA: Diagnosis not present

## 2016-10-07 ENCOUNTER — Emergency Department (HOSPITAL_BASED_OUTPATIENT_CLINIC_OR_DEPARTMENT_OTHER): Payer: BLUE CROSS/BLUE SHIELD

## 2016-10-07 ENCOUNTER — Emergency Department (HOSPITAL_BASED_OUTPATIENT_CLINIC_OR_DEPARTMENT_OTHER)
Admission: EM | Admit: 2016-10-07 | Discharge: 2016-10-08 | Disposition: A | Discharge status: Home / Self Care | Payer: BLUE CROSS/BLUE SHIELD | Attending: Emergency Medicine | Admitting: Emergency Medicine

## 2016-10-07 ENCOUNTER — Encounter (HOSPITAL_BASED_OUTPATIENT_CLINIC_OR_DEPARTMENT_OTHER): Payer: Self-pay

## 2016-10-07 DIAGNOSIS — R197 Diarrhea, unspecified: Secondary | ICD-10-CM | POA: Diagnosis not present

## 2016-10-07 DIAGNOSIS — K8500 Idiopathic acute pancreatitis without necrosis or infection: Secondary | ICD-10-CM | POA: Diagnosis not present

## 2016-10-07 DIAGNOSIS — Z79899 Other long term (current) drug therapy: Secondary | ICD-10-CM | POA: Insufficient documentation

## 2016-10-07 DIAGNOSIS — R109 Unspecified abdominal pain: Secondary | ICD-10-CM | POA: Diagnosis not present

## 2016-10-07 LAB — LIPID PANEL
Cholesterol: 136 mg/dL (ref 0–200)
HDL: 35 mg/dL — AB (ref >40)
LDL CALC: 65 mg/dL (ref 0–99)
Total CHOL/HDL Ratio: 3.9 RATIO
Triglycerides: 181 mg/dL — ABNORMAL HIGH (ref <150)
VLDL: 36 mg/dL (ref 0–40)

## 2016-10-07 LAB — LIPASE, BLOOD: Lipase: 251 U/L — ABNORMAL HIGH (ref 11–51)

## 2016-10-07 LAB — COMPREHENSIVE METABOLIC PANEL
ALBUMIN: 3.7 g/dL (ref 3.5–5.0)
ALK PHOS: 97 U/L (ref 38–126)
ALT: 35 U/L (ref 14–54)
ANION GAP: 9 (ref 5–15)
AST: 33 U/L (ref 15–41)
BILIRUBIN TOTAL: 0.6 mg/dL (ref 0.3–1.2)
BUN: 9 mg/dL (ref 6–20)
CALCIUM: 8.7 mg/dL — AB (ref 8.9–10.3)
CO2: 28 mmol/L (ref 22–32)
Chloride: 102 mmol/L (ref 101–111)
Creatinine, Ser: 1.1 mg/dL — ABNORMAL HIGH (ref 0.44–1.00)
GFR calc Af Amer: >60 mL/min (ref >60)
GFR calc non Af Amer: 57 mL/min — ABNORMAL LOW (ref >60)
GLUCOSE: 108 mg/dL — AB (ref 65–99)
Potassium: 3.2 mmol/L — ABNORMAL LOW (ref 3.5–5.1)
SODIUM: 139 mmol/L (ref 135–145)
Total Protein: 6.7 g/dL (ref 6.5–8.1)

## 2016-10-07 LAB — CBC WITH DIFFERENTIAL/PLATELET
BASOS ABS: 0 10*3/uL (ref 0.0–0.1)
BASOS PCT: 0 %
EOS ABS: 0.2 10*3/uL (ref 0.0–0.7)
Eosinophils Relative: 2 %
HEMATOCRIT: 36.2 % (ref 36.0–46.0)
Hemoglobin: 12 g/dL (ref 12.0–15.0)
LYMPHS ABS: 3.2 10*3/uL (ref 0.7–4.0)
Lymphocytes Relative: 36 %
MCH: 30.9 pg (ref 26.0–34.0)
MCHC: 33.1 g/dL (ref 30.0–36.0)
MCV: 93.3 fL (ref 78.0–100.0)
Monocytes Absolute: 1.1 10*3/uL — ABNORMAL HIGH (ref 0.1–1.0)
Monocytes Relative: 12 %
NEUTROS ABS: 4.4 10*3/uL (ref 1.7–7.7)
Neutrophils Relative %: 50 %
Platelets: 422 10*3/uL — ABNORMAL HIGH (ref 150–400)
RBC: 3.88 MIL/uL (ref 3.87–5.11)
RDW: 11.9 % (ref 11.5–15.5)
WBC: 8.9 10*3/uL (ref 4.0–10.5)

## 2016-10-07 MED ORDER — IOPAMIDOL (ISOVUE-300) INJECTION 61%
100.0000 mL | Freq: Once | INTRAVENOUS | Status: AC | PRN
  Administered 2016-10-07: 100 mL via INTRAVENOUS

## 2016-10-07 MED ORDER — SODIUM CHLORIDE 0.9 % IV BOLUS (SEPSIS)
1000.0000 mL | Freq: Once | INTRAVENOUS | Status: AC
  Administered 2016-10-07: 1000 mL via INTRAVENOUS

## 2016-10-07 NOTE — ED Notes (Signed)
Pt states she has a CT scan appt for tomorrow

## 2016-10-07 NOTE — ED Provider Notes (Signed)
[facility]   CSN: [id number] Arrival date & time: 10/07/16  1901     History   Chief Complaint Chief Complaint  Patient presents with  . Abdominal Pain    HPI Carla Hooper is a 51 y.o. female.  HPI Patient presents to the emergency department with abdominal pain with diarrhea and fever over the last 2 weeks.  Patient was seen in the ER this past Saturday.  She also seen her primary care doctor, place, and the GI doctor.  She states she was diagnosed with salmonella.  She states that she did see her PCP yesterday.  She states that she was given antibiotics from the emergency department.  Patient states that she did not take any other medications prior to arrival.  The patient states that she wanted a CT scan done when she was in the emergency room on Saturday but they did not do one.  She states that she felt like it was warranted. The patient denies chest pain, shortness of breath, headache,blurred vision, neck pain, fever, cough, weakness, numbness, dizziness, anorexia, edema,  nausea, vomiting,  rash, back pain, dysuria, hematemesis, bloody stool, near syncope, or syncope. History reviewed. No pertinent past medical history.  Patient Active Problem List   Diagnosis Date Noted  . ALLERGIC RHINITIS 02/28/2007  . COUGH 02/28/2007    Past Surgical History:  Procedure Laterality Date  . CESAREAN SECTION      OB History    No data available       Home Medications    Prior to Admission medications   Medication Sig Start Date End Date Taking? Authorizing Provider  acetaminophen (TYLENOL) 500 MG tablet Take 500 mg by mouth every 6 (six) hours as needed for fever.    [provider]  ciprofloxacin (CIPRO) 500 MG tablet Take 1 tablet (500 mg total) by mouth 2 (two) times daily. 10/02/16   Doug Sou, MD  hydrochlorothiazide (HYDRODIURIL) 25 MG tablet Take 1 tablet (25 mg total) by mouth daily. Patient not taking: Reported on 10/02/2016  02/22/14   Gwyneth Sprout, MD  loperamide (IMODIUM) 1 MG/5ML solution Take 2 mg by mouth as needed for diarrhea or loose stools.    [provider]  LORazepam (ATIVAN) 0.5 MG tablet Take 0.25-0.5 mg by mouth every 8 (eight) hours as needed for anxiety.  08/06/16   [provider]  losartan-hydrochlorothiazide (HYZAAR) 100-25 MG tablet Take 0.5 tablets by mouth 2 (two) times daily.  07/20/16   [provider]  naproxen (NAPROSYN) 500 MG tablet take 1 tablet by mouth every 8 hours if needed for pain 07/21/16   [provider]  ondansetron (ZOFRAN) 8 MG tablet Take 8 mg by mouth every 8 (eight) hours as needed for nausea.  09/29/16   [provider]  ranitidine (ZANTAC) 300 MG tablet Take 300 mg by mouth every evening. 09/29/16   [provider]  zolpidem (AMBIEN) 10 MG tablet Take 10 mg by mouth at bedtime as needed for sleep.  09/08/16   [provider]    Family History No family history on file.  Social History Social History  Substance Use Topics  . Smoking status: Never Smoker  . Smokeless tobacco: Never Used  . Alcohol use Yes     Comment: daily     Allergies   Patient has no known allergies.   Review of Systems Review of Systems All other systems negative except as documented in the HPI. All pertinent positives  and negatives as reviewed in the HPI.  Physical Exam Updated Vital Signs BP (!) 159/94 (BP Location: Right Arm)   Pulse 82   Temp 98.6 F (37 C) (Oral)   Resp 18   Ht 5\' 2"  (1.575 m)   Wt 70.3 kg (155 lb)   LMP 02/22/2014   SpO2 100%   BMI 28.35 kg/m   Physical Exam  Constitutional: She is oriented to person, place, and time. She appears well-developed and well-nourished. No distress.  HENT:  Head: Normocephalic and atraumatic.  Mouth/Throat: Oropharynx is clear and moist.  Eyes: Pupils are equal, round, and reactive to light.  Neck: Normal range of motion. Neck supple.  Cardiovascular: Normal  rate, regular rhythm and normal heart sounds.  Exam reveals no gallop and no friction rub.   No murmur heard. Pulmonary/Chest: Effort normal and breath sounds normal. No respiratory distress. She has no wheezes.  Abdominal: Soft. Bowel sounds are normal. She exhibits no distension and no mass. There is tenderness. There is no guarding.  Neurological: She is alert and oriented to person, place, and time. She exhibits normal muscle tone. Coordination normal.  Skin: Skin is warm and dry. Capillary refill takes less than 2 seconds. No rash noted. No erythema.  Psychiatric: She has a normal mood and affect. Her behavior is normal.  Nursing note and vitals reviewed.    ED Treatments / Results  Labs (all labs ordered are listed, but only abnormal results are displayed) Labs Reviewed  CBC WITH DIFFERENTIAL/PLATELET - Abnormal; Notable for the following:       Result Value   Platelets 422 (*)    Monocytes Absolute 1.1 (*)    All other components within normal limits  COMPREHENSIVE METABOLIC PANEL - Abnormal; Notable for the following:    Potassium 3.2 (*)    Glucose, Bld 108 (*)    Creatinine, Ser 1.10 (*)    Calcium 8.7 (*)    GFR calc non Af Amer 57 (*)    All other components within normal limits  LIPID PANEL - Abnormal; Notable for the following:    Triglycerides 181 (*)    HDL 35 (*)    All other components within normal limits  LIPASE, BLOOD - Abnormal; Notable for the following:    Lipase 251 (*)    All other components within normal limits    EKG  EKG Interpretation None       Radiology Ct Abdomen Pelvis W Contrast  Result Date: 10/07/2016 CLINICAL DATA:  Abdominal pain with diarrhea and fever EXAM: CT ABDOMEN AND PELVIS WITH CONTRAST TECHNIQUE: Multidetector CT imaging of the abdomen and pelvis was performed using the standard protocol following bolus administration of intravenous contrast. CONTRAST:  ISOVUE-300 IOPAMIDOL (ISOVUE-300) INJECTION 61% COMPARISON:   Eight hundred twenty-one 1,018 FINDINGS: Lower chest: No acute abnormality. Hepatobiliary: No focal liver abnormality is seen. No gallstones, gallbladder wall thickening, or biliary dilatation. Pancreas: Unremarkable. No pancreatic ductal dilatation or surrounding inflammatory changes. Spleen: Normal in size without focal abnormality. Adrenals/Urinary Tract: Adrenal glands are unremarkable. Kidneys are normal, without renal calculi, focal lesion, or hydronephrosis. Bladder is unremarkable. Stomach/Bowel: Stomach is within normal limits. Appendix appears normal. No evidence of bowel wall thickening, distention, or inflammatory changes. Vascular/Lymphatic: No significant vascular findings are present. No enlarged abdominal or pelvic lymph nodes. Reproductive: No adnexal mass. Sagittal views suggest endometrial thickening up to 15 mm. Other: Negative for free air or free fluid. Small fat in the umbilicus Musculoskeletal: No acute or significant  osseous findings. IMPRESSION: 1. No CT evidence for acute intra-abdominal or pelvic pathology 2. Suspected thickening of endometrial stripe up to 15 mm. Recommend correlation with menopausal status with follow-up ultrasound as indicated. Electronically Signed   By: Jasmine PangKim  Fujinaga M.D.   On: 10/07/2016 23:00    Procedures Procedures (including critical care time)  Medications Ordered in ED Medications  sodium chloride 0.9 % bolus 1,000 mL (0 mLs Intravenous Stopped 10/07/16 2336)  iopamidol (ISOVUE-300) 61 % injection 100 mL (100 mLs Intravenous Contrast Given 10/07/16 2230)     Initial Impression / Assessment and Plan / ED Course  I have reviewed the triage vital signs and the nursing notes.  Pertinent labs & imaging results that were available during my care of the patient were reviewed by me and considered in my medical decision making (see chart for details).     The patient has a negative CT scan.  The patient is advised to continue the ciprofloxacin.   Patient is advised plan and all questions were answered.  I did counsel the patient on clear liquid diet and more bland dietary foods.  I feel the patient has a multifactorial component to this and will need follow-up with GI, Dr. patient agrees the plan.  Patient has stable here in the emergency department  Final Clinical Impressions(s) / ED Diagnoses   Final diagnoses:  None    New Prescriptions New Prescriptions   No medications on file     Charlestine NightLawyer, Jorie Zee, Cordelia Poche-C 10/12/16 [id number]    Mesner, Barbara CowerJason, MD 10/12/16 1714

## 2016-10-07 NOTE — ED Notes (Signed)
Per PCP-states she is being sent for pancreatitis, possible admission

## 2016-10-07 NOTE — ED Triage Notes (Signed)
C/o abd pain, diarrhea, fever x 2 weeks-was seen by PCP x 2 and GI x 1-dx with salmonella-last PCP yesterday-was called by PCP today and advised to come to ED due to abnormal labs-NAD-anxious/tearful

## 2016-10-08 MED ORDER — PROMETHAZINE HCL 25 MG PO TABS: 25.0000 mg | ORAL_TABLET | Freq: Three times a day (TID) | ORAL | 0 refills | Status: AC | PRN

## 2016-10-08 MED ORDER — HYDROCODONE-ACETAMINOPHEN 5-325 MG PO TABS: 1.0000 | ORAL_TABLET | Freq: Four times a day (QID) | ORAL | 0 refills | Status: AC | PRN

## 2016-10-08 NOTE — Discharge Instructions (Signed)
Return here as needed.  Follow-up with your primary doctor.  Follow a clear liquid/bland diet over the next 48 hours

## 2016-10-12 DIAGNOSIS — K8500 Idiopathic acute pancreatitis without necrosis or infection: Secondary | ICD-10-CM | POA: Diagnosis not present

## 2016-10-26 DIAGNOSIS — K8501 Idiopathic acute pancreatitis with uninfected necrosis: Secondary | ICD-10-CM | POA: Diagnosis not present

## 2016-10-26 DIAGNOSIS — A09 Infectious gastroenteritis and colitis, unspecified: Secondary | ICD-10-CM | POA: Diagnosis not present

## 2016-10-26 DIAGNOSIS — R1084 Generalized abdominal pain: Secondary | ICD-10-CM | POA: Diagnosis not present

## 2016-11-16 DIAGNOSIS — R9389 Abnormal findings on diagnostic imaging of other specified body structures: Secondary | ICD-10-CM | POA: Diagnosis not present

## 2016-11-17 DIAGNOSIS — R9389 Abnormal findings on diagnostic imaging of other specified body structures: Secondary | ICD-10-CM | POA: Diagnosis not present

## 2017-07-29 DIAGNOSIS — Z1231 Encounter for screening mammogram for malignant neoplasm of breast: Secondary | ICD-10-CM | POA: Diagnosis not present

## 2017-08-02 DIAGNOSIS — Z6829 Body mass index (BMI) 29.0-29.9, adult: Secondary | ICD-10-CM | POA: Diagnosis not present

## 2017-08-02 DIAGNOSIS — Z01419 Encounter for gynecological examination (general) (routine) without abnormal findings: Secondary | ICD-10-CM | POA: Diagnosis not present

## 2017-08-15 DIAGNOSIS — R1903 Right lower quadrant abdominal swelling, mass and lump: Secondary | ICD-10-CM | POA: Diagnosis not present

## 2017-08-31 DIAGNOSIS — F419 Anxiety disorder, unspecified: Secondary | ICD-10-CM | POA: Diagnosis not present

## 2017-08-31 DIAGNOSIS — I10 Essential (primary) hypertension: Secondary | ICD-10-CM | POA: Diagnosis not present

## 2017-08-31 DIAGNOSIS — R109 Unspecified abdominal pain: Secondary | ICD-10-CM | POA: Diagnosis not present

## 2017-09-07 DIAGNOSIS — Z0000 Encounter for general adult medical examination without abnormal findings: Secondary | ICD-10-CM | POA: Diagnosis not present

## 2017-09-09 DIAGNOSIS — Z1210 Encounter for screening for malignant neoplasm of intestinal tract, unspecified: Secondary | ICD-10-CM | POA: Diagnosis not present

## 2017-09-09 DIAGNOSIS — R1084 Generalized abdominal pain: Secondary | ICD-10-CM | POA: Diagnosis not present

## 2017-10-28 DIAGNOSIS — K648 Other hemorrhoids: Secondary | ICD-10-CM | POA: Diagnosis not present

## 2017-10-28 DIAGNOSIS — K621 Rectal polyp: Secondary | ICD-10-CM | POA: Diagnosis not present

## 2017-10-28 DIAGNOSIS — Z1211 Encounter for screening for malignant neoplasm of colon: Secondary | ICD-10-CM | POA: Diagnosis not present

## 2017-10-28 DIAGNOSIS — D123 Benign neoplasm of transverse colon: Secondary | ICD-10-CM | POA: Diagnosis not present

## 2017-10-28 DIAGNOSIS — D120 Benign neoplasm of cecum: Secondary | ICD-10-CM | POA: Diagnosis not present

## 2017-11-01 DIAGNOSIS — D120 Benign neoplasm of cecum: Secondary | ICD-10-CM | POA: Diagnosis not present

## 2017-11-01 DIAGNOSIS — Z1211 Encounter for screening for malignant neoplasm of colon: Secondary | ICD-10-CM | POA: Diagnosis not present

## 2017-11-01 DIAGNOSIS — K621 Rectal polyp: Secondary | ICD-10-CM | POA: Diagnosis not present

## 2017-11-01 DIAGNOSIS — D123 Benign neoplasm of transverse colon: Secondary | ICD-10-CM | POA: Diagnosis not present

## 2017-11-22 DIAGNOSIS — F411 Generalized anxiety disorder: Secondary | ICD-10-CM | POA: Diagnosis not present

## 2018-02-14 DIAGNOSIS — Z23 Encounter for immunization: Secondary | ICD-10-CM | POA: Diagnosis not present

## 2018-03-14 DIAGNOSIS — R509 Fever, unspecified: Secondary | ICD-10-CM | POA: Diagnosis not present

## 2018-03-14 DIAGNOSIS — J069 Acute upper respiratory infection, unspecified: Secondary | ICD-10-CM | POA: Diagnosis not present

## 2018-09-27 DIAGNOSIS — Z1231 Encounter for screening mammogram for malignant neoplasm of breast: Secondary | ICD-10-CM | POA: Diagnosis not present

## 2018-09-27 DIAGNOSIS — Z6828 Body mass index (BMI) 28.0-28.9, adult: Secondary | ICD-10-CM | POA: Diagnosis not present

## 2018-09-27 DIAGNOSIS — Z01419 Encounter for gynecological examination (general) (routine) without abnormal findings: Secondary | ICD-10-CM | POA: Diagnosis not present

## 2018-09-27 DIAGNOSIS — N951 Menopausal and female climacteric states: Secondary | ICD-10-CM | POA: Diagnosis not present

## 2018-10-18 DIAGNOSIS — Z0000 Encounter for general adult medical examination without abnormal findings: Secondary | ICD-10-CM | POA: Diagnosis not present

## 2018-10-18 DIAGNOSIS — N390 Urinary tract infection, site not specified: Secondary | ICD-10-CM | POA: Diagnosis not present

## 2018-10-27 DIAGNOSIS — R3129 Other microscopic hematuria: Secondary | ICD-10-CM | POA: Diagnosis not present

## 2018-10-27 DIAGNOSIS — I10 Essential (primary) hypertension: Secondary | ICD-10-CM | POA: Diagnosis not present

## 2018-10-27 DIAGNOSIS — Z23 Encounter for immunization: Secondary | ICD-10-CM | POA: Diagnosis not present

## 2018-10-27 DIAGNOSIS — Z0000 Encounter for general adult medical examination without abnormal findings: Secondary | ICD-10-CM | POA: Diagnosis not present

## 2018-11-14 DIAGNOSIS — I10 Essential (primary) hypertension: Secondary | ICD-10-CM | POA: Diagnosis not present

## 2018-11-14 DIAGNOSIS — E781 Pure hyperglyceridemia: Secondary | ICD-10-CM | POA: Diagnosis not present

## 2019-01-03 DIAGNOSIS — I10 Essential (primary) hypertension: Secondary | ICD-10-CM | POA: Diagnosis not present

## 2019-02-16 DIAGNOSIS — Z20822 Contact with and (suspected) exposure to covid-19: Secondary | ICD-10-CM | POA: Diagnosis not present

## 2019-02-16 DIAGNOSIS — U071 COVID-19: Secondary | ICD-10-CM | POA: Diagnosis not present

## 2019-02-16 DIAGNOSIS — Z9189 Other specified personal risk factors, not elsewhere classified: Secondary | ICD-10-CM | POA: Diagnosis not present

## 2019-03-02 IMAGING — CT CT ABD-PELV W/ CM
2 of 5 series · 16 of 46 positions shown, 18 images · IV contrast (APPLIED)
Comparison: Eight hundred twenty-one 1,018

CLINICAL DATA: Abdominal pain with diarrhea and fever

EXAM:
CT ABDOMEN AND PELVIS WITH CONTRAST
TECHNIQUE: Multidetector CT imaging of the abdomen and pelvis was performed
using the standard protocol following bolus administration of
intravenous contrast.
CONTRAST:  100mL D0CDCW-411 IOPAMIDOL (D0CDCW-411) INJECTION 61%

[Series 2: axial st · axial · 0.80mm/px · z∈[-288,+157]mm · 13 of 101 slices shown, 15 images]
[im 6/101  soft-tissue]
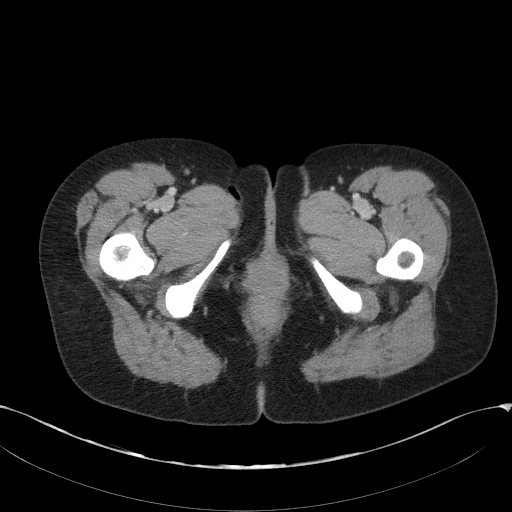
[im 6/101  bone]
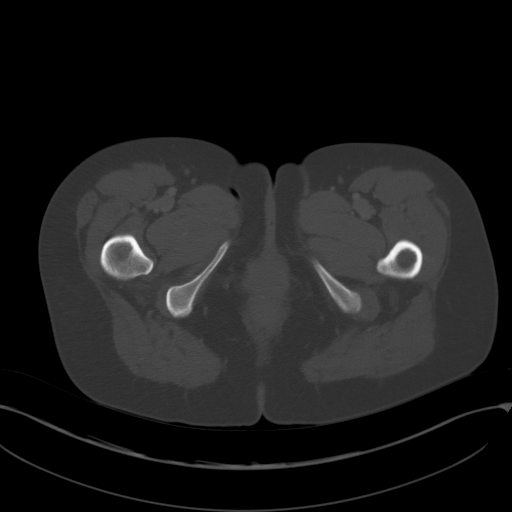
[im 12/101  soft-tissue]
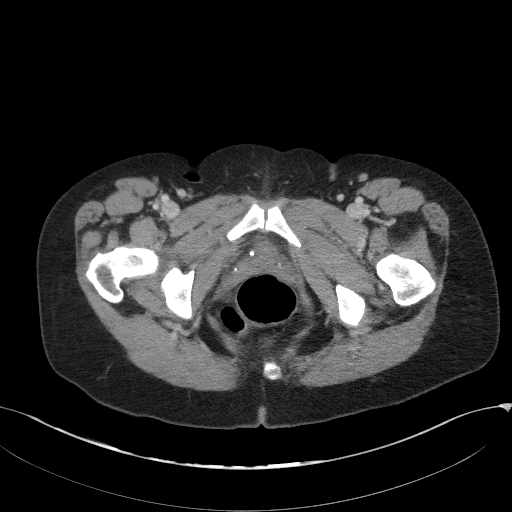
[im 23/101  soft-tissue]
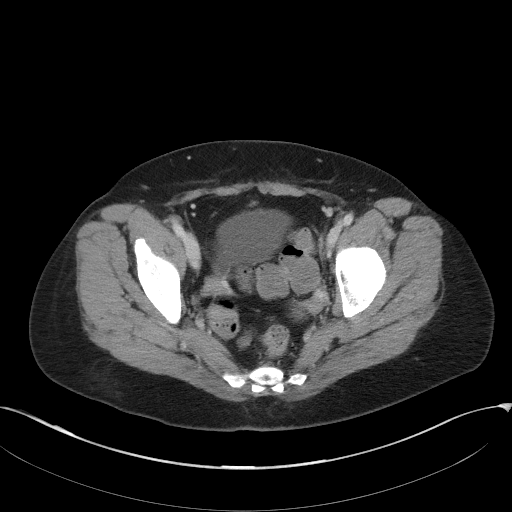
[im 28/101  soft-tissue]
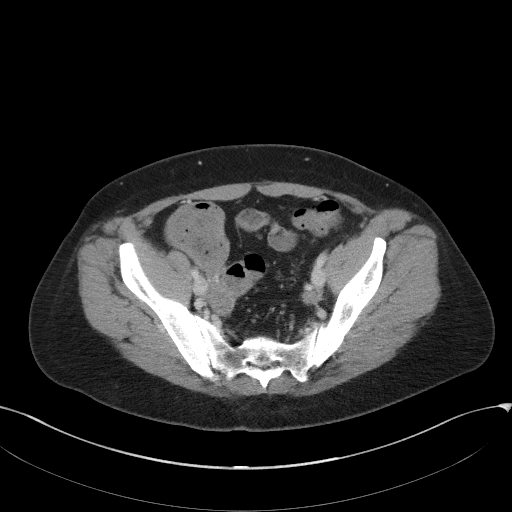
[im 34/101  soft-tissue]
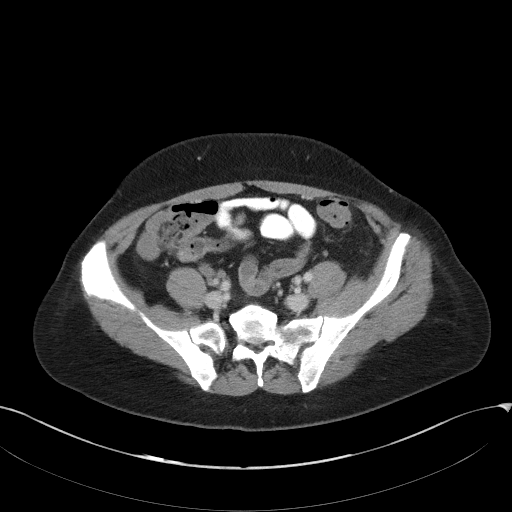
[im 45/101  soft-tissue]
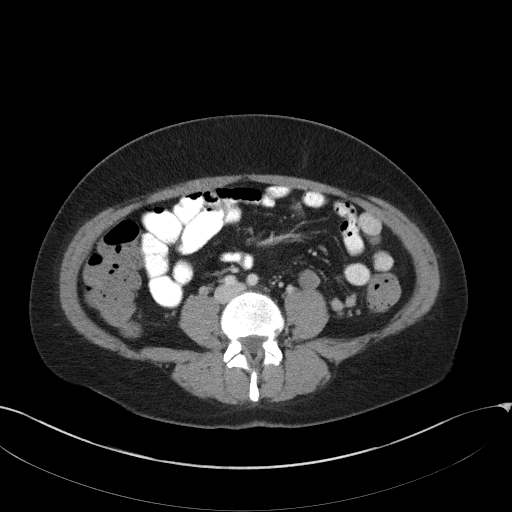
[im 51/101  soft-tissue]
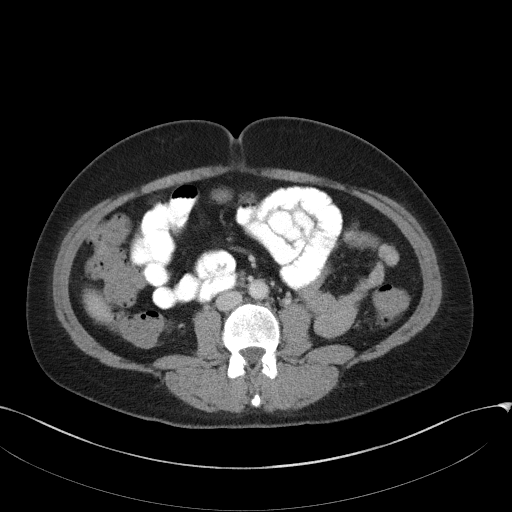
[im 56/101  soft-tissue]
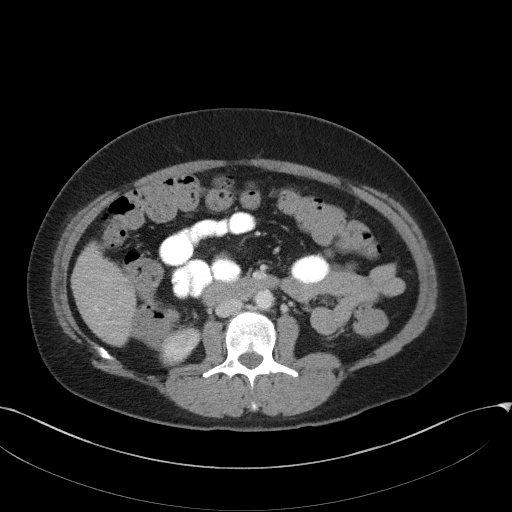
[im 67/101  soft-tissue]
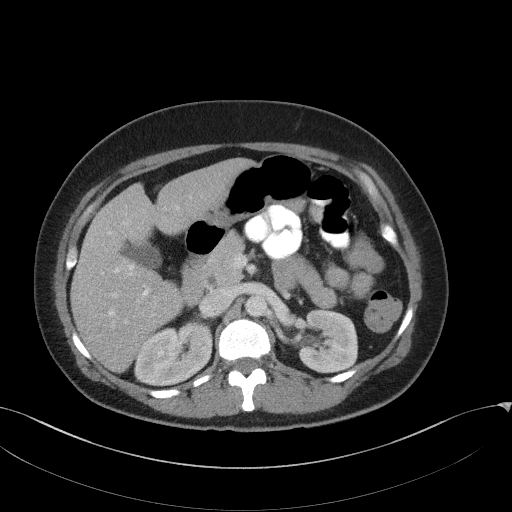
[im 67/101  bone]
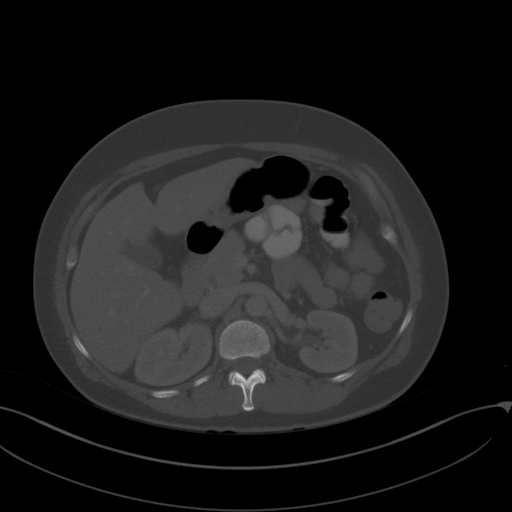
[im 73/101  soft-tissue]
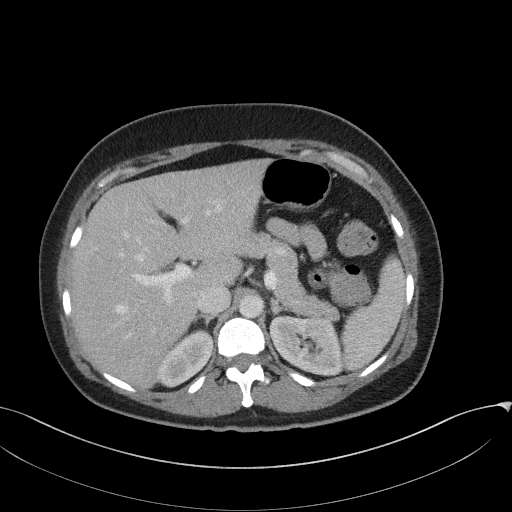
[im 78/101  soft-tissue]
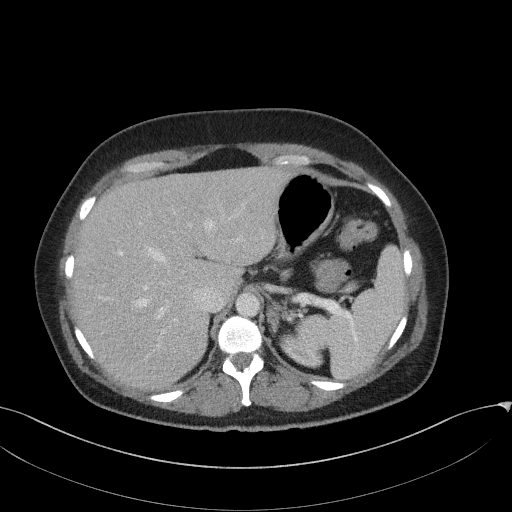
[im 89/101  soft-tissue]
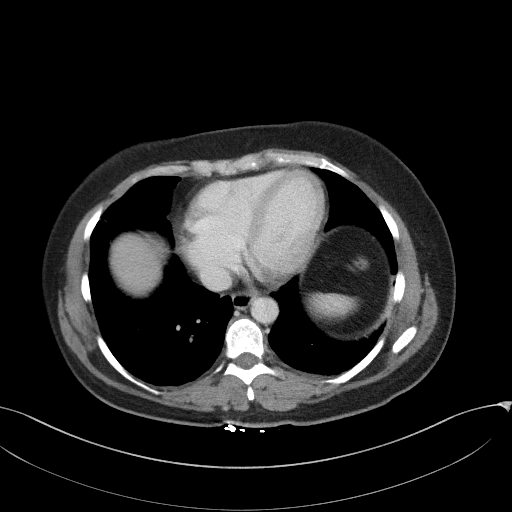
[im 95/101  soft-tissue]
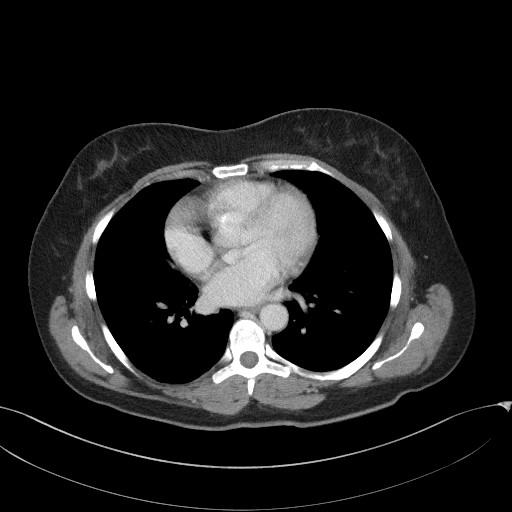

[Series 5: coronal st · coronal · 0.75mm/px · 3 of 89 slices shown]
[im 30/89  soft-tissue]
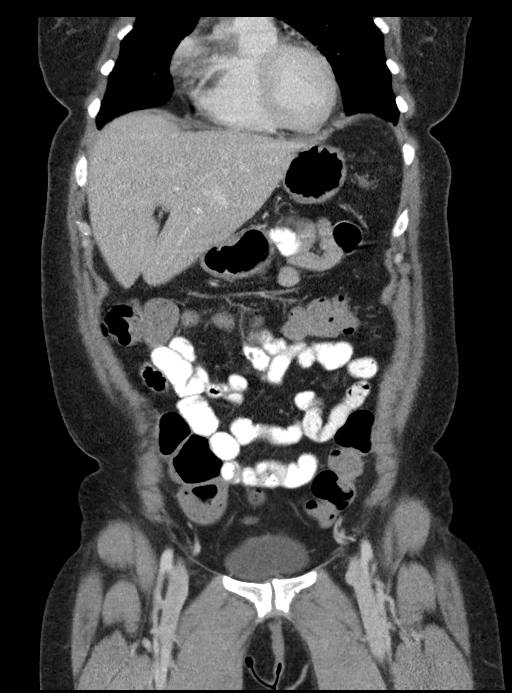
[im 40/89  soft-tissue]
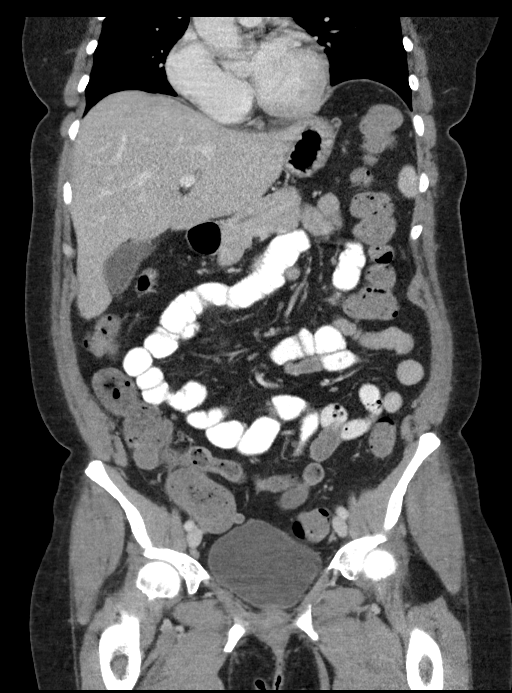
[im 49/89  soft-tissue]
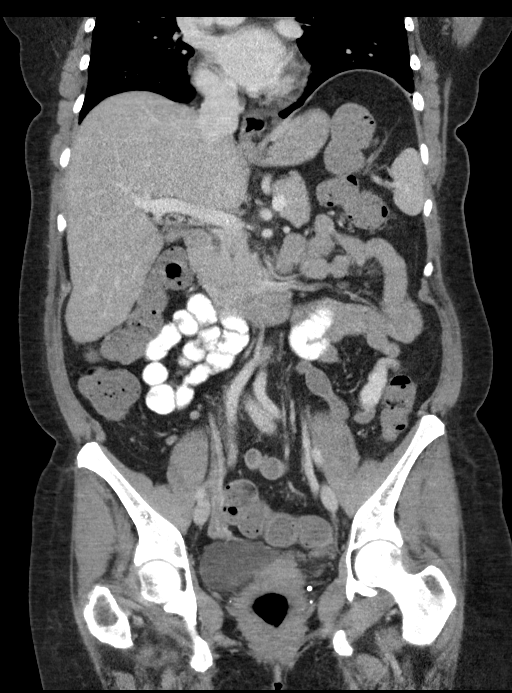

[16 of 46 positions shown; findings below may reference images not displayed]

FINDINGS: Lower chest: No acute abnormality.

Hepatobiliary: No focal liver abnormality is seen. No gallstones,
gallbladder wall thickening, or biliary dilatation.

Pancreas: Unremarkable. No pancreatic ductal dilatation or
surrounding inflammatory changes.

Spleen: Normal in size without focal abnormality.

Adrenals/Urinary Tract: Adrenal glands are unremarkable. Kidneys are
normal, without renal calculi, focal lesion, or hydronephrosis.
Bladder is unremarkable.

Stomach/Bowel: Stomach is within normal limits. Appendix appears
normal. No evidence of bowel wall thickening, distention, or
inflammatory changes.

Vascular/Lymphatic: No significant vascular findings are present. No
enlarged abdominal or pelvic lymph nodes.

Reproductive: No adnexal mass. Sagittal views suggest endometrial
thickening up to 15 mm.

Other: Negative for free air or free fluid. Small fat in the
umbilicus

Musculoskeletal: No acute or significant osseous findings.
IMPRESSION: 1. No CT evidence for acute intra-abdominal or pelvic pathology
2. Suspected thickening of endometrial stripe up to 15 mm. Recommend
correlation with menopausal status with follow-up ultrasound as
indicated.

## 2019-06-12 ENCOUNTER — Ambulatory Visit: Payer: Self-pay | Attending: Internal Medicine

## 2019-06-12 DIAGNOSIS — Z23 Encounter for immunization: Secondary | ICD-10-CM

## 2019-06-12 NOTE — Progress Notes (Signed)
   Covid-19 Vaccination Clinic  Name:  Carla Hooper    MRN: [id number] DOB: [birth year]  06/12/2019  [name] was observed post Covid-19 immunization for 15 minutes without incident. She was provided with Vaccine Information Sheet and instruction to access the V-Safe system.   [name] was instructed to call 911 with any severe reactions post vaccine: Marland Kitchen Difficulty breathing  . Swelling of face and throat  . A fast heartbeat  . A bad rash all over body  . Dizziness and weakness   Immunizations Administered    Name Date Dose VIS Date Route   Pfizer COVID-19 Vaccine 06/12/2019  8:22 AM 0.3 mL 04/04/2018 Intramuscular   Manufacturer: [employer], [employer]   Lot: [id number]   NDC: [address]-5

## 2019-11-13 DIAGNOSIS — I10 Essential (primary) hypertension: Secondary | ICD-10-CM | POA: Diagnosis not present

## 2019-11-13 DIAGNOSIS — N390 Urinary tract infection, site not specified: Secondary | ICD-10-CM | POA: Diagnosis not present

## 2019-11-13 DIAGNOSIS — Z0000 Encounter for general adult medical examination without abnormal findings: Secondary | ICD-10-CM | POA: Diagnosis not present

## 2019-11-16 DIAGNOSIS — I10 Essential (primary) hypertension: Secondary | ICD-10-CM | POA: Diagnosis not present

## 2019-11-16 DIAGNOSIS — Z23 Encounter for immunization: Secondary | ICD-10-CM | POA: Diagnosis not present

## 2019-11-16 DIAGNOSIS — Z0001 Encounter for general adult medical examination with abnormal findings: Secondary | ICD-10-CM | POA: Diagnosis not present

## 2019-11-16 DIAGNOSIS — F411 Generalized anxiety disorder: Secondary | ICD-10-CM | POA: Diagnosis not present

## 2019-11-16 DIAGNOSIS — F5104 Psychophysiologic insomnia: Secondary | ICD-10-CM | POA: Diagnosis not present

## 2019-11-16 DIAGNOSIS — R059 Cough, unspecified: Secondary | ICD-10-CM | POA: Diagnosis not present

## 2019-12-06 DIAGNOSIS — Z01419 Encounter for gynecological examination (general) (routine) without abnormal findings: Secondary | ICD-10-CM | POA: Diagnosis not present

## 2019-12-06 DIAGNOSIS — Z1231 Encounter for screening mammogram for malignant neoplasm of breast: Secondary | ICD-10-CM | POA: Diagnosis not present

## 2019-12-06 DIAGNOSIS — Z13820 Encounter for screening for osteoporosis: Secondary | ICD-10-CM | POA: Diagnosis not present

## 2019-12-06 DIAGNOSIS — Z6828 Body mass index (BMI) 28.0-28.9, adult: Secondary | ICD-10-CM | POA: Diagnosis not present

## 2020-11-20 DIAGNOSIS — Z0000 Encounter for general adult medical examination without abnormal findings: Secondary | ICD-10-CM | POA: Diagnosis not present

## 2020-11-26 DIAGNOSIS — Z0000 Encounter for general adult medical examination without abnormal findings: Secondary | ICD-10-CM | POA: Diagnosis not present

## 2020-11-26 DIAGNOSIS — F411 Generalized anxiety disorder: Secondary | ICD-10-CM | POA: Diagnosis not present

## 2020-11-26 DIAGNOSIS — F5104 Psychophysiologic insomnia: Secondary | ICD-10-CM | POA: Diagnosis not present

## 2020-11-26 DIAGNOSIS — I10 Essential (primary) hypertension: Secondary | ICD-10-CM | POA: Diagnosis not present

## 2020-11-26 DIAGNOSIS — Z23 Encounter for immunization: Secondary | ICD-10-CM | POA: Diagnosis not present

## 2020-11-27 ENCOUNTER — Other Ambulatory Visit: Payer: Self-pay | Admitting: Internal Medicine

## 2020-11-27 DIAGNOSIS — I10 Essential (primary) hypertension: Secondary | ICD-10-CM

## 2021-01-19 DIAGNOSIS — Z1231 Encounter for screening mammogram for malignant neoplasm of breast: Secondary | ICD-10-CM | POA: Diagnosis not present

## 2021-01-19 DIAGNOSIS — Z6829 Body mass index (BMI) 29.0-29.9, adult: Secondary | ICD-10-CM | POA: Diagnosis not present

## 2021-01-19 DIAGNOSIS — Z01419 Encounter for gynecological examination (general) (routine) without abnormal findings: Secondary | ICD-10-CM | POA: Diagnosis not present

## 2021-01-27 DIAGNOSIS — J8410 Pulmonary fibrosis, unspecified: Secondary | ICD-10-CM | POA: Diagnosis not present

## 2021-01-27 DIAGNOSIS — K760 Fatty (change of) liver, not elsewhere classified: Secondary | ICD-10-CM | POA: Diagnosis not present

## 2021-01-27 DIAGNOSIS — Z23 Encounter for immunization: Secondary | ICD-10-CM | POA: Diagnosis not present

## 2021-11-26 DIAGNOSIS — Z0000 Encounter for general adult medical examination without abnormal findings: Secondary | ICD-10-CM | POA: Diagnosis not present

## 2021-12-01 DIAGNOSIS — F5104 Psychophysiologic insomnia: Secondary | ICD-10-CM | POA: Diagnosis not present

## 2021-12-01 DIAGNOSIS — Z23 Encounter for immunization: Secondary | ICD-10-CM | POA: Diagnosis not present

## 2021-12-01 DIAGNOSIS — Z0000 Encounter for general adult medical examination without abnormal findings: Secondary | ICD-10-CM | POA: Diagnosis not present

## 2021-12-01 DIAGNOSIS — F411 Generalized anxiety disorder: Secondary | ICD-10-CM | POA: Diagnosis not present

## 2021-12-01 DIAGNOSIS — I10 Essential (primary) hypertension: Secondary | ICD-10-CM | POA: Diagnosis not present

## 2021-12-01 DIAGNOSIS — Z8719 Personal history of other diseases of the digestive system: Secondary | ICD-10-CM | POA: Diagnosis not present

## 2022-01-28 DIAGNOSIS — Z1231 Encounter for screening mammogram for malignant neoplasm of breast: Secondary | ICD-10-CM | POA: Diagnosis not present

## 2022-01-28 DIAGNOSIS — Z6829 Body mass index (BMI) 29.0-29.9, adult: Secondary | ICD-10-CM | POA: Diagnosis not present

## 2022-01-28 DIAGNOSIS — Z13820 Encounter for screening for osteoporosis: Secondary | ICD-10-CM | POA: Diagnosis not present

## 2022-01-28 DIAGNOSIS — Z01419 Encounter for gynecological examination (general) (routine) without abnormal findings: Secondary | ICD-10-CM | POA: Diagnosis not present

## 2022-04-13 DIAGNOSIS — L814 Other melanin hyperpigmentation: Secondary | ICD-10-CM | POA: Diagnosis not present

## 2022-04-13 DIAGNOSIS — L821 Other seborrheic keratosis: Secondary | ICD-10-CM | POA: Diagnosis not present

## 2022-04-13 DIAGNOSIS — L578 Other skin changes due to chronic exposure to nonionizing radiation: Secondary | ICD-10-CM | POA: Diagnosis not present

## 2022-04-13 DIAGNOSIS — F424 Excoriation (skin-picking) disorder: Secondary | ICD-10-CM | POA: Diagnosis not present

## 2022-12-23 DIAGNOSIS — Z0000 Encounter for general adult medical examination without abnormal findings: Secondary | ICD-10-CM | POA: Diagnosis not present

## 2022-12-30 DIAGNOSIS — Z0000 Encounter for general adult medical examination without abnormal findings: Secondary | ICD-10-CM | POA: Diagnosis not present

## 2022-12-30 DIAGNOSIS — R3129 Other microscopic hematuria: Secondary | ICD-10-CM | POA: Diagnosis not present

## 2022-12-30 DIAGNOSIS — R7401 Elevation of levels of liver transaminase levels: Secondary | ICD-10-CM | POA: Diagnosis not present

## 2022-12-30 DIAGNOSIS — Z860101 Personal history of adenomatous and serrated colon polyps: Secondary | ICD-10-CM | POA: Diagnosis not present

## 2022-12-30 DIAGNOSIS — I10 Essential (primary) hypertension: Secondary | ICD-10-CM | POA: Diagnosis not present

## 2023-02-28 DIAGNOSIS — Z124 Encounter for screening for malignant neoplasm of cervix: Secondary | ICD-10-CM | POA: Diagnosis not present

## 2023-02-28 DIAGNOSIS — Z1231 Encounter for screening mammogram for malignant neoplasm of breast: Secondary | ICD-10-CM | POA: Diagnosis not present

## 2023-02-28 DIAGNOSIS — Z1151 Encounter for screening for human papillomavirus (HPV): Secondary | ICD-10-CM | POA: Diagnosis not present

## 2023-02-28 DIAGNOSIS — Z01419 Encounter for gynecological examination (general) (routine) without abnormal findings: Secondary | ICD-10-CM | POA: Diagnosis not present

## 2023-04-18 ENCOUNTER — Other Ambulatory Visit (HOSPITAL_BASED_OUTPATIENT_CLINIC_OR_DEPARTMENT_OTHER): Payer: Self-pay

## 2023-04-18 MED ORDER — LOSARTAN POTASSIUM-HCTZ 100-25 MG PO TABS
1.0000 | ORAL_TABLET | Freq: Two times a day (BID) | ORAL | 1 refills | Status: DC
  Filled 2023-04-18 – 2023-06-04 (×2): qty 90, 45d supply, fill #0
  Filled 2023-09-05: qty 90, 45d supply, fill #1

## 2023-04-22 ENCOUNTER — Other Ambulatory Visit (HOSPITAL_BASED_OUTPATIENT_CLINIC_OR_DEPARTMENT_OTHER): Payer: Self-pay

## 2023-04-28 ENCOUNTER — Other Ambulatory Visit (HOSPITAL_BASED_OUTPATIENT_CLINIC_OR_DEPARTMENT_OTHER): Payer: Self-pay

## 2023-05-17 ENCOUNTER — Other Ambulatory Visit (HOSPITAL_BASED_OUTPATIENT_CLINIC_OR_DEPARTMENT_OTHER): Payer: Self-pay

## 2023-05-17 MED ORDER — PEG-3350/ELECTROLYTES 236 G PO SOLR
4000.0000 mL | ORAL | 0 refills | Status: AC
  Filled 2023-05-17: qty 4000, 1d supply, fill #0

## 2023-05-17 MED ORDER — DULCOLAX 5 MG PO TBEC
DELAYED_RELEASE_TABLET | ORAL | 0 refills | Status: AC
  Filled 2023-05-17: qty 4, 1d supply, fill #0

## 2023-05-18 ENCOUNTER — Other Ambulatory Visit (HOSPITAL_BASED_OUTPATIENT_CLINIC_OR_DEPARTMENT_OTHER): Payer: Self-pay

## 2023-05-21 ENCOUNTER — Other Ambulatory Visit (HOSPITAL_BASED_OUTPATIENT_CLINIC_OR_DEPARTMENT_OTHER): Payer: Self-pay

## 2023-05-25 ENCOUNTER — Other Ambulatory Visit (HOSPITAL_BASED_OUTPATIENT_CLINIC_OR_DEPARTMENT_OTHER): Payer: Self-pay

## 2023-05-25 MED ORDER — ZOLPIDEM TARTRATE 5 MG PO TABS
7.5000 mg | ORAL_TABLET | Freq: Every day | ORAL | 1 refills | Status: DC
  Filled 2023-05-25 – 2023-05-27 (×2): qty 135, 90d supply, fill #0
  Filled 2023-08-29: qty 135, 90d supply, fill #1

## 2023-05-26 DIAGNOSIS — Z09 Encounter for follow-up examination after completed treatment for conditions other than malignant neoplasm: Secondary | ICD-10-CM | POA: Diagnosis not present

## 2023-05-26 DIAGNOSIS — D123 Benign neoplasm of transverse colon: Secondary | ICD-10-CM | POA: Diagnosis not present

## 2023-05-26 DIAGNOSIS — Z860100 Personal history of colon polyps, unspecified: Secondary | ICD-10-CM | POA: Diagnosis not present

## 2023-05-27 ENCOUNTER — Other Ambulatory Visit (HOSPITAL_BASED_OUTPATIENT_CLINIC_OR_DEPARTMENT_OTHER): Payer: Self-pay

## 2023-05-27 ENCOUNTER — Ambulatory Visit (INDEPENDENT_AMBULATORY_CARE_PROVIDER_SITE_OTHER): Payer: Self-pay | Admitting: Surgical

## 2023-05-27 VITALS — BP 122/81 | HR 102 | Ht 62.0 in | Wt 155.0 lb

## 2023-05-27 DIAGNOSIS — Z411 Encounter for cosmetic surgery: Secondary | ICD-10-CM

## 2023-05-27 NOTE — Progress Notes (Signed)
 Botulinum Toxin Procedure Note  Procedure: Cosmetic botulinum toxin  Pre-operative Diagnosis: Dynamic rhytides  Post-operative Diagnosis: Same  Complications:  None  Brief history: The patient desires botulinum toxin injection.  She is aware of the risks including bleeding, damage to deeper structures, asymmetry, brow ptosis, eyelid ptosis, bruising. The patient understands and wishes to proceed.  Procedure: The area was prepped with alcohol and dried with a clean gauze.  Using a clean technique the botulinum toxin was diluted with 2.5 mL of bacteriostatic saline per 100 unit vial which resulted in 4 units per 0.1 mL.  Subsequently the mixture was injected in the glabellar, lateral canthal lines. A total of 24 Units of botulinum toxin was used. The glabellar area was injected with care to inject intramuscular only while holding pressure on the supratrochlear vessels in each area during each injection on either side of the medial corrugators.  No complications were noted. Light pressure was held for 5 minutes. She was instructed explicitly in post-operative care.  Botox LOT:  [id number] EXP:  2027/02  Filler Injection Procedure Note  Procedure: Lucetta administration  Pre-operative Diagnosis: Facial lines, nasolabial folds, midface volume loss  Post-operative Diagnosis: Same  Complications:  None  Brief history: The patient desires filler injection of her nasolabial folds.  I discussed with the patient this proposed procedure of filler injections.  We discussed filler injections or customize depending on the particular needs of the patient, it is performed on various locations of the face for temporary correction.  We discussed that fillers on average last approximately 12 months.  We discussed this can vary from patient the patient.  We discussed the risks including bleeding, scarring, formation of a granuloma, infection, asymmetry, vascular occlusion resulting in skin necrosis,  vascular occlusion resulting in blindness.   Procedure: The area was prepped with alcohol and dried with a clean gauze.  The nasolabial fold were then injected proceeding in a superior to inferior technique.  The filler was injected in the mid to deep dermis.  Aspiration was performed with each injection to ensure needle was not placed within a vessel.  The nasolabial folds were injected bilaterally, patient tolerated this well there was no complications.  Patient was instructed in postoperative care.  All of her questions were answered to her content.  We did discuss recommendations for follow-up  Restylane Defyne 1ml - Exp 08/08/2023 Lot [id number]

## 2023-06-02 ENCOUNTER — Ambulatory Visit (INDEPENDENT_AMBULATORY_CARE_PROVIDER_SITE_OTHER): Payer: Self-pay | Admitting: Surgical

## 2023-06-02 DIAGNOSIS — Z411 Encounter for cosmetic surgery: Secondary | ICD-10-CM

## 2023-06-02 NOTE — Progress Notes (Signed)
 Botulinum Toxin and Filler Injection Procedure Note  Procedure: Filler administration  Pre-operative Diagnosis: Nasolabial folds  Post-operative Diagnosis: Same  Complications:  None  Brief history: The patient desires filler injection of her nasolabial folds.  Patient was here previously for filler to the nasolabial folds, she would like some additional filler placed in the left superior nasolabial fold due to some asymmetry.  Of note, the asymmetry was present prior to injection, she just needs some additional filler placed in this area.  She reports that after the last injection she did really well, had minimal swelling, had no bruising.  She is overall very happy.  We discussed the risks including bleeding, scarring, formation of a granuloma, infection, asymmetry, vascular occlusion resulting in skin necrosis, vascular occlusion resulting in blindness.   Procedure: The area was prepped with alcohol and dried with a clean gauze.  The nasolabial fold on the left side was then injected proceeding in a superior to inferior technique.  The filler was injected in the mid to deep dermis.  Aspiration was performed with each injection to ensure needle was not placed within a vessel.  The nasolabial folds were injected only on the left side, patient tolerated this well there was no complications.  A total of 0.1 cc was injected.  Patient was instructed in postoperative care.  All of her questions were answered to her content.  We did discuss recommendations for follow-up  Restylane Defyne - Exp 08/08/2023 Lot [id number]

## 2023-06-04 ENCOUNTER — Other Ambulatory Visit (HOSPITAL_BASED_OUTPATIENT_CLINIC_OR_DEPARTMENT_OTHER): Payer: Self-pay

## 2023-06-06 ENCOUNTER — Other Ambulatory Visit (HOSPITAL_BASED_OUTPATIENT_CLINIC_OR_DEPARTMENT_OTHER): Payer: Self-pay

## 2023-06-06 MED ORDER — IBUPROFEN 600 MG PO TABS
600.0000 mg | ORAL_TABLET | Freq: Three times a day (TID) | ORAL | 0 refills | Status: DC
  Filled 2023-06-06: qty 60, 20d supply, fill #0

## 2023-06-06 MED ORDER — PREDNISONE 10 MG PO TABS
ORAL_TABLET | ORAL | 0 refills | Status: AC
  Filled 2023-06-06: qty 16, 6d supply, fill #0

## 2023-07-11 ENCOUNTER — Other Ambulatory Visit (HOSPITAL_BASED_OUTPATIENT_CLINIC_OR_DEPARTMENT_OTHER): Payer: Self-pay

## 2023-07-11 MED ORDER — AMOXICILLIN-POT CLAVULANATE 875-125 MG PO TABS
1.0000 | ORAL_TABLET | Freq: Two times a day (BID) | ORAL | 0 refills | Status: AC
  Filled 2023-07-11: qty 20, 10d supply, fill #0

## 2023-07-18 DIAGNOSIS — R7401 Elevation of levels of liver transaminase levels: Secondary | ICD-10-CM | POA: Diagnosis not present

## 2023-07-20 DIAGNOSIS — F5104 Psychophysiologic insomnia: Secondary | ICD-10-CM | POA: Diagnosis not present

## 2023-07-20 DIAGNOSIS — I10 Essential (primary) hypertension: Secondary | ICD-10-CM | POA: Diagnosis not present

## 2023-07-26 ENCOUNTER — Other Ambulatory Visit (HOSPITAL_BASED_OUTPATIENT_CLINIC_OR_DEPARTMENT_OTHER): Payer: Self-pay

## 2023-07-26 MED ORDER — LORAZEPAM 0.5 MG PO TABS
0.5000 mg | ORAL_TABLET | Freq: Three times a day (TID) | ORAL | 2 refills | Status: DC | PRN
  Filled 2023-07-26: qty 90, 30d supply, fill #0

## 2023-08-29 ENCOUNTER — Other Ambulatory Visit (HOSPITAL_BASED_OUTPATIENT_CLINIC_OR_DEPARTMENT_OTHER): Payer: Self-pay

## 2023-09-05 ENCOUNTER — Other Ambulatory Visit: Payer: Self-pay

## 2023-09-05 ENCOUNTER — Other Ambulatory Visit (HOSPITAL_BASED_OUTPATIENT_CLINIC_OR_DEPARTMENT_OTHER): Payer: Self-pay

## 2023-09-29 DIAGNOSIS — I10 Essential (primary) hypertension: Secondary | ICD-10-CM | POA: Diagnosis not present

## 2023-09-29 DIAGNOSIS — Z0000 Encounter for general adult medical examination without abnormal findings: Secondary | ICD-10-CM | POA: Diagnosis not present

## 2023-10-07 DIAGNOSIS — F5104 Psychophysiologic insomnia: Secondary | ICD-10-CM | POA: Diagnosis not present

## 2023-10-07 DIAGNOSIS — F321 Major depressive disorder, single episode, moderate: Secondary | ICD-10-CM | POA: Diagnosis not present

## 2023-10-07 DIAGNOSIS — I10 Essential (primary) hypertension: Secondary | ICD-10-CM | POA: Diagnosis not present

## 2023-10-07 DIAGNOSIS — Z0000 Encounter for general adult medical examination without abnormal findings: Secondary | ICD-10-CM | POA: Diagnosis not present

## 2023-10-14 ENCOUNTER — Other Ambulatory Visit (HOSPITAL_BASED_OUTPATIENT_CLINIC_OR_DEPARTMENT_OTHER): Payer: Self-pay

## 2023-10-14 MED ORDER — LOSARTAN POTASSIUM-HCTZ 100-25 MG PO TABS
1.0000 | ORAL_TABLET | Freq: Two times a day (BID) | ORAL | 1 refills | Status: DC
  Filled 2023-10-14 – 2023-10-25 (×2): qty 90, 45d supply, fill #0
  Filled 2023-12-08: qty 90, 45d supply, fill #1

## 2023-10-24 ENCOUNTER — Other Ambulatory Visit (HOSPITAL_BASED_OUTPATIENT_CLINIC_OR_DEPARTMENT_OTHER): Payer: Self-pay

## 2023-10-25 ENCOUNTER — Other Ambulatory Visit (HOSPITAL_BASED_OUTPATIENT_CLINIC_OR_DEPARTMENT_OTHER): Payer: Self-pay

## 2023-10-31 ENCOUNTER — Other Ambulatory Visit (HOSPITAL_BASED_OUTPATIENT_CLINIC_OR_DEPARTMENT_OTHER): Payer: Self-pay

## 2023-11-13 ENCOUNTER — Other Ambulatory Visit (HOSPITAL_BASED_OUTPATIENT_CLINIC_OR_DEPARTMENT_OTHER): Payer: Self-pay

## 2023-11-14 ENCOUNTER — Other Ambulatory Visit (HOSPITAL_BASED_OUTPATIENT_CLINIC_OR_DEPARTMENT_OTHER): Payer: Self-pay

## 2023-11-14 MED ORDER — LORAZEPAM 0.5 MG PO TABS
0.5000 mg | ORAL_TABLET | Freq: Three times a day (TID) | ORAL | 2 refills | Status: AC | PRN
  Filled 2023-11-14: qty 90, 30d supply, fill #0
  Filled 2024-01-07 – 2024-01-31 (×2): qty 90, 30d supply, fill #1

## 2023-11-24 ENCOUNTER — Other Ambulatory Visit (HOSPITAL_BASED_OUTPATIENT_CLINIC_OR_DEPARTMENT_OTHER): Payer: Self-pay

## 2023-11-24 MED ORDER — ZOLPIDEM TARTRATE 5 MG PO TABS
7.5000 mg | ORAL_TABLET | Freq: Every day | ORAL | 1 refills | Status: AC
  Filled 2023-11-26: qty 135, 90d supply, fill #0
  Filled 2024-02-20: qty 135, 90d supply, fill #1

## 2023-11-26 ENCOUNTER — Other Ambulatory Visit (HOSPITAL_BASED_OUTPATIENT_CLINIC_OR_DEPARTMENT_OTHER): Payer: Self-pay

## 2023-12-08 ENCOUNTER — Other Ambulatory Visit (HOSPITAL_BASED_OUTPATIENT_CLINIC_OR_DEPARTMENT_OTHER): Payer: Self-pay

## 2023-12-09 ENCOUNTER — Other Ambulatory Visit (HOSPITAL_BASED_OUTPATIENT_CLINIC_OR_DEPARTMENT_OTHER): Payer: Self-pay

## 2023-12-09 MED ORDER — AMLODIPINE BESYLATE 5 MG PO TABS
5.0000 mg | ORAL_TABLET | Freq: Every day | ORAL | 0 refills | Status: DC
  Filled 2023-12-09: qty 90, 90d supply, fill #0

## 2023-12-19 ENCOUNTER — Other Ambulatory Visit (HOSPITAL_BASED_OUTPATIENT_CLINIC_OR_DEPARTMENT_OTHER): Payer: Self-pay

## 2023-12-19 MED ORDER — IBUPROFEN 600 MG PO TABS
600.0000 mg | ORAL_TABLET | Freq: Three times a day (TID) | ORAL | 0 refills | Status: AC
  Filled 2023-12-19: qty 60, 20d supply, fill #0

## 2023-12-29 ENCOUNTER — Other Ambulatory Visit (HOSPITAL_BASED_OUTPATIENT_CLINIC_OR_DEPARTMENT_OTHER): Payer: Self-pay

## 2023-12-29 MED ORDER — AZITHROMYCIN 250 MG PO TABS
ORAL_TABLET | ORAL | 0 refills | Status: AC
  Filled 2023-12-29: qty 6, 5d supply, fill #0
  Filled 2023-12-30: qty 6, 5d supply, fill #1

## 2023-12-29 MED ORDER — PREDNISONE 10 MG (21) PO TBPK
ORAL_TABLET | ORAL | 0 refills | Status: AC
  Filled 2023-12-29: qty 21, 6d supply, fill #0

## 2023-12-30 ENCOUNTER — Other Ambulatory Visit (HOSPITAL_BASED_OUTPATIENT_CLINIC_OR_DEPARTMENT_OTHER): Payer: Self-pay

## 2024-01-09 ENCOUNTER — Other Ambulatory Visit: Payer: Self-pay

## 2024-01-19 ENCOUNTER — Other Ambulatory Visit (HOSPITAL_BASED_OUTPATIENT_CLINIC_OR_DEPARTMENT_OTHER): Payer: Self-pay

## 2024-01-23 ENCOUNTER — Other Ambulatory Visit (HOSPITAL_BASED_OUTPATIENT_CLINIC_OR_DEPARTMENT_OTHER): Payer: Self-pay

## 2024-01-23 MED ORDER — LOSARTAN POTASSIUM-HCTZ 100-25 MG PO TABS
1.0000 | ORAL_TABLET | Freq: Two times a day (BID) | ORAL | 1 refills | Status: AC
  Filled 2024-01-23 – 2024-01-31 (×2): qty 90, 45d supply, fill #0
  Filled 2024-03-09 – 2024-03-10 (×2): qty 90, 45d supply, fill #1

## 2024-01-31 ENCOUNTER — Other Ambulatory Visit (HOSPITAL_COMMUNITY): Payer: Self-pay

## 2024-01-31 ENCOUNTER — Other Ambulatory Visit (HOSPITAL_BASED_OUTPATIENT_CLINIC_OR_DEPARTMENT_OTHER): Payer: Self-pay

## 2024-02-11 ENCOUNTER — Other Ambulatory Visit (HOSPITAL_BASED_OUTPATIENT_CLINIC_OR_DEPARTMENT_OTHER): Payer: Self-pay

## 2024-02-21 ENCOUNTER — Other Ambulatory Visit (HOSPITAL_BASED_OUTPATIENT_CLINIC_OR_DEPARTMENT_OTHER): Payer: Self-pay

## 2024-02-23 ENCOUNTER — Other Ambulatory Visit (HOSPITAL_BASED_OUTPATIENT_CLINIC_OR_DEPARTMENT_OTHER): Payer: Self-pay

## 2024-03-06 ENCOUNTER — Other Ambulatory Visit (HOSPITAL_BASED_OUTPATIENT_CLINIC_OR_DEPARTMENT_OTHER): Payer: Self-pay

## 2024-03-06 MED ORDER — AMLODIPINE BESYLATE 5 MG PO TABS
5.0000 mg | ORAL_TABLET | Freq: Every day | ORAL | 0 refills | Status: AC
  Filled 2024-03-06: qty 90, 90d supply, fill #0

## 2024-03-09 ENCOUNTER — Other Ambulatory Visit (HOSPITAL_BASED_OUTPATIENT_CLINIC_OR_DEPARTMENT_OTHER): Payer: Self-pay

## 2024-03-09 MED ORDER — IPRATROPIUM BROMIDE 0.06 % NA SOLN
2.0000 | Freq: Two times a day (BID) | NASAL | 5 refills | Status: AC | PRN
  Filled 2024-03-09: qty 15, 38d supply, fill #0

## 2024-03-12 ENCOUNTER — Other Ambulatory Visit (HOSPITAL_BASED_OUTPATIENT_CLINIC_OR_DEPARTMENT_OTHER): Payer: Self-pay
